# Patient Record
Sex: Male | Born: 1945 | Race: White | Hispanic: No | Marital: Married | Smoking: Never smoker
Health system: Southern US, Community
[De-identification: ages and names within clinical notes are randomized; demographics above are authoritative.]

## PROBLEM LIST (undated history)

## (undated) DIAGNOSIS — I10 Essential (primary) hypertension: Secondary | ICD-10-CM

## (undated) DIAGNOSIS — I4891 Unspecified atrial fibrillation: Secondary | ICD-10-CM

## (undated) DIAGNOSIS — I6990 Unspecified sequelae of unspecified cerebrovascular disease: Secondary | ICD-10-CM

## (undated) DIAGNOSIS — E785 Hyperlipidemia, unspecified: Secondary | ICD-10-CM

## (undated) HISTORY — DX: Unspecified sequelae of unspecified cerebrovascular disease: I69.90

## (undated) HISTORY — DX: Essential (primary) hypertension: I10

## (undated) HISTORY — PX: CATARACT EXTRACTION: SUR2

---

## 2000-02-06 ENCOUNTER — Encounter: Admission: RE | Admit: 2000-02-06 | Discharge: 2000-02-06 | Payer: Self-pay | Admitting: Sports Medicine

## 2000-02-08 ENCOUNTER — Encounter: Admission: RE | Admit: 2000-02-08 | Discharge: 2000-02-08 | Payer: Self-pay | Admitting: Sports Medicine

## 2005-03-14 ENCOUNTER — Encounter: Admission: RE | Admit: 2005-03-14 | Discharge: 2005-03-14 | Payer: Self-pay | Admitting: Family Medicine

## 2005-04-17 ENCOUNTER — Encounter: Admission: RE | Admit: 2005-04-17 | Discharge: 2005-04-17 | Payer: Self-pay | Admitting: Family Medicine

## 2005-04-25 ENCOUNTER — Encounter: Admission: RE | Admit: 2005-04-25 | Discharge: 2005-04-25 | Payer: Self-pay | Admitting: Family Medicine

## 2005-05-16 ENCOUNTER — Encounter: Admission: RE | Admit: 2005-05-16 | Discharge: 2005-05-16 | Payer: Self-pay | Admitting: Family Medicine

## 2005-07-16 ENCOUNTER — Encounter: Admission: RE | Admit: 2005-07-16 | Discharge: 2005-07-16 | Payer: Self-pay | Admitting: Family Medicine

## 2005-07-24 ENCOUNTER — Encounter: Admission: RE | Admit: 2005-07-24 | Discharge: 2005-07-24 | Payer: Self-pay | Admitting: Family Medicine

## 2005-07-31 ENCOUNTER — Encounter: Admission: RE | Admit: 2005-07-31 | Discharge: 2005-07-31 | Payer: Self-pay | Admitting: Family Medicine

## 2005-08-21 ENCOUNTER — Encounter: Admission: RE | Admit: 2005-08-21 | Discharge: 2005-08-21 | Payer: Self-pay | Admitting: Family Medicine

## 2006-03-20 ENCOUNTER — Encounter: Admission: RE | Admit: 2006-03-20 | Discharge: 2006-03-20 | Payer: Self-pay | Admitting: Diagnostic Radiology

## 2007-07-27 IMAGING — US US EXTREM LOW VENOUS BILAT
1 series · 13 of 24 positions shown · non-contrast
Comparison: none

CLINICAL DATA: Varicose veins.
 BILATERAL LOWER EXTREMITY VENOUS ULTRASOUND:
TECHNIQUE: Gray scale, color, and Doppler interrogation of the lower extremity venous systems was performed.

[Series 1: unknown · 13 of 52 slices shown]
[im 1/52]
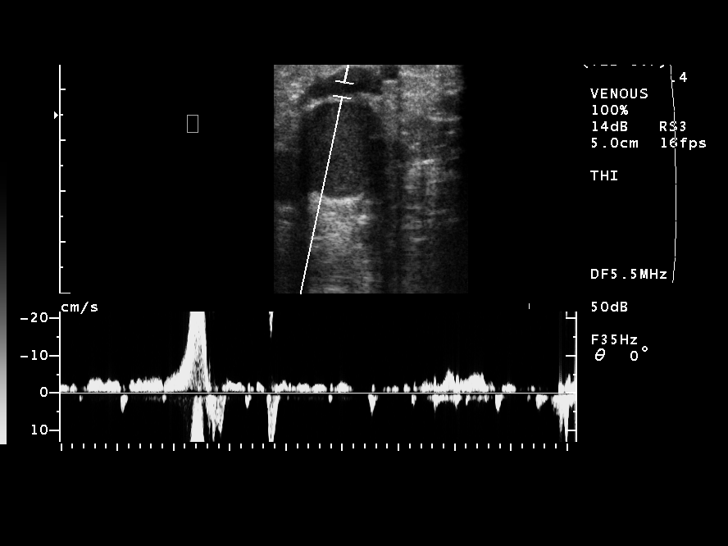
[im 5/52]
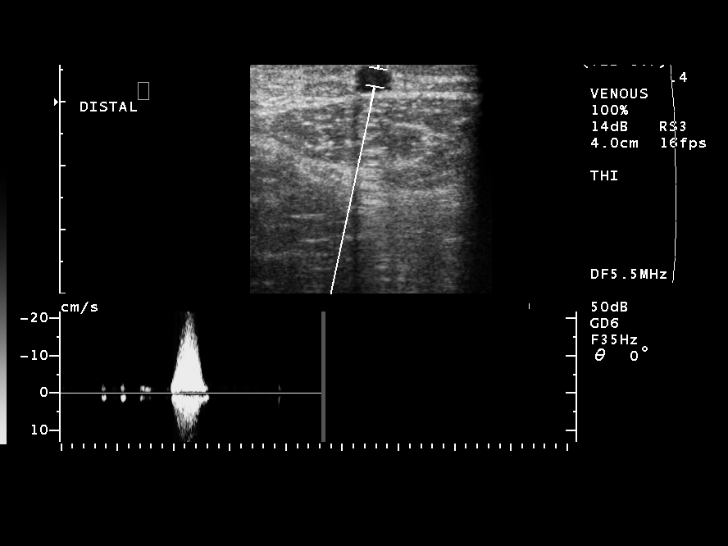
[im 9/52]
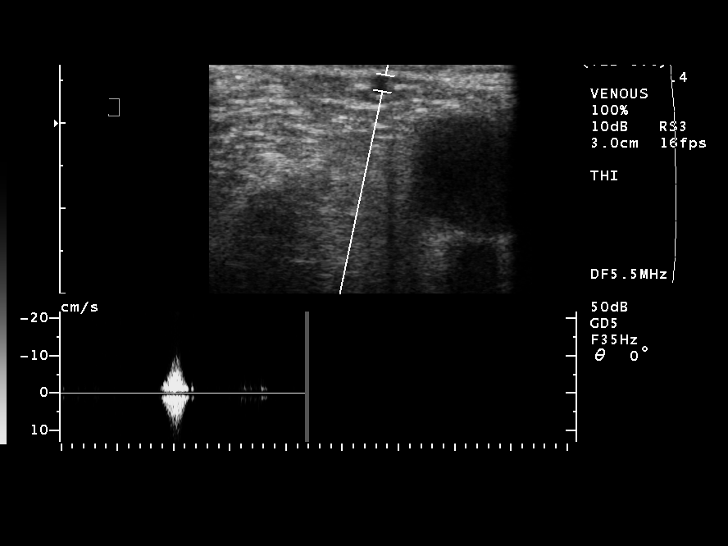
[im 14/52]
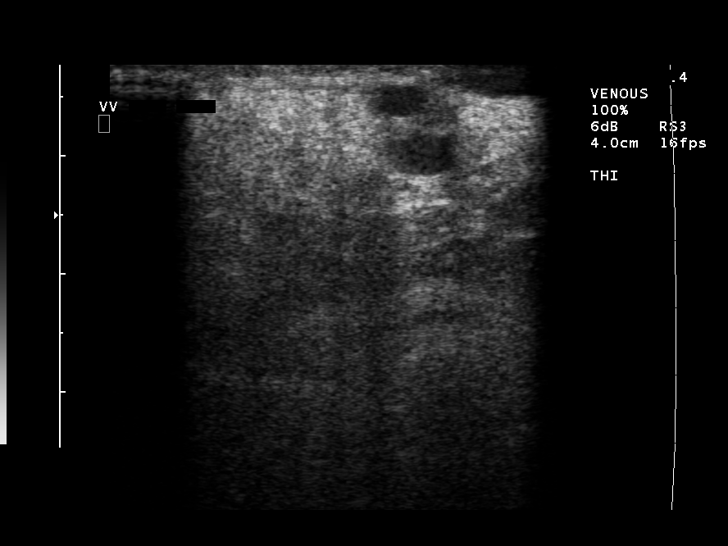
[im 18/52]
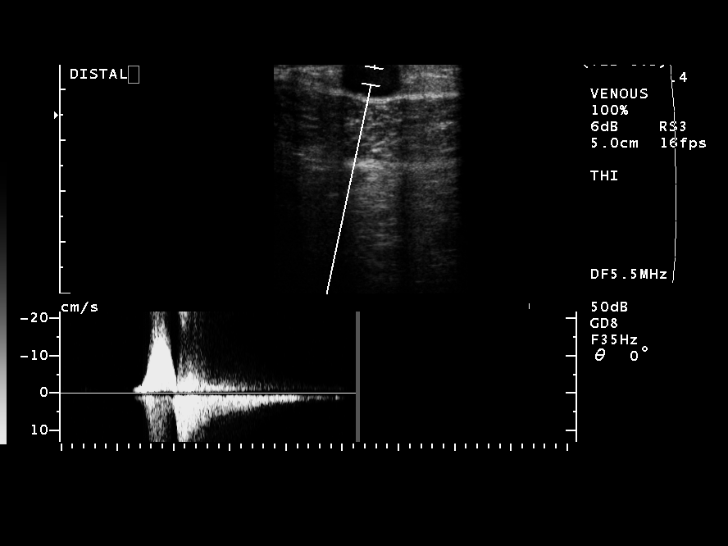
[im 23/52]
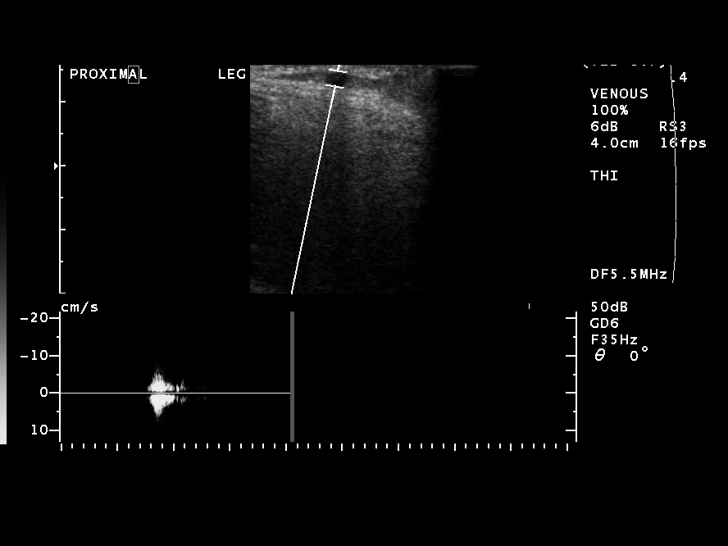
[im 27/52]
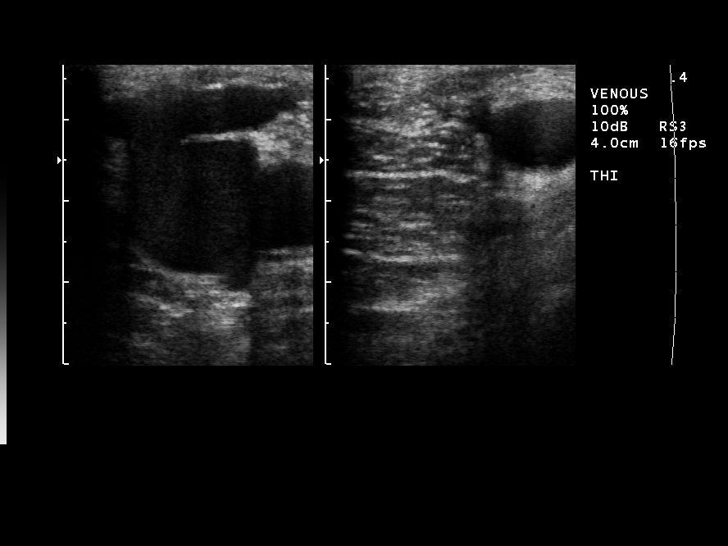
[im 29/52]
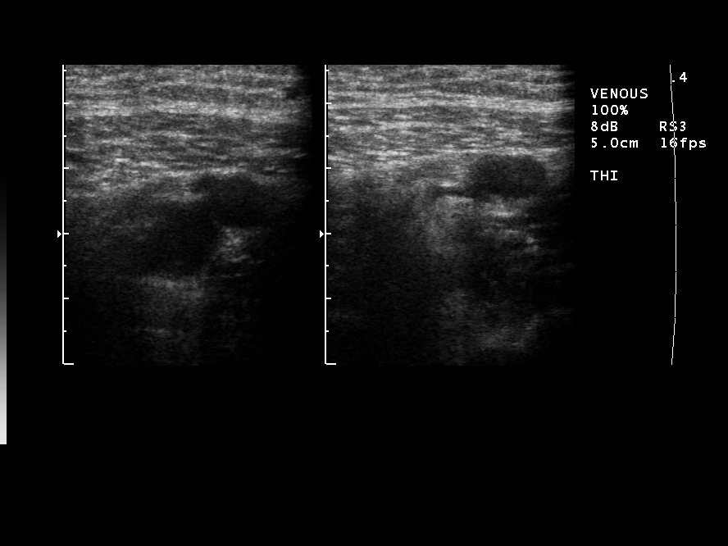
[im 34/52]
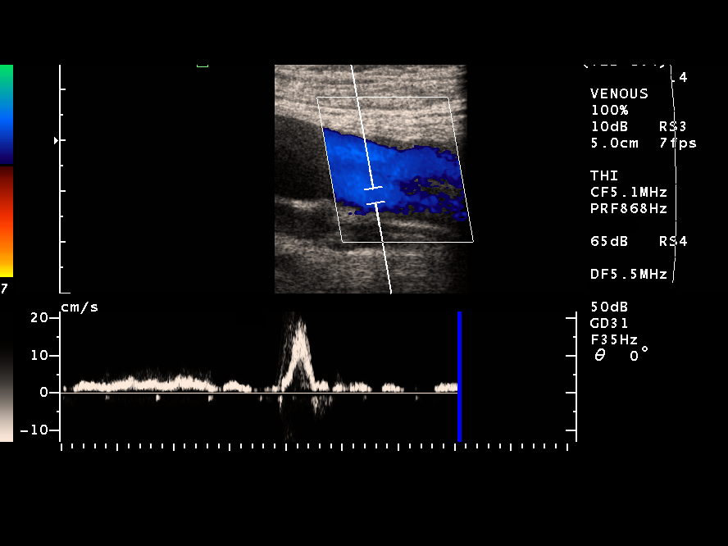
[im 38/52]
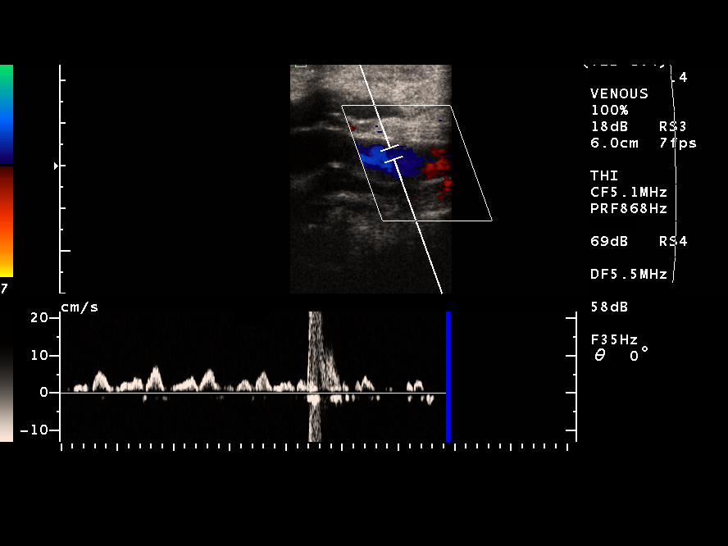
[im 43/52]
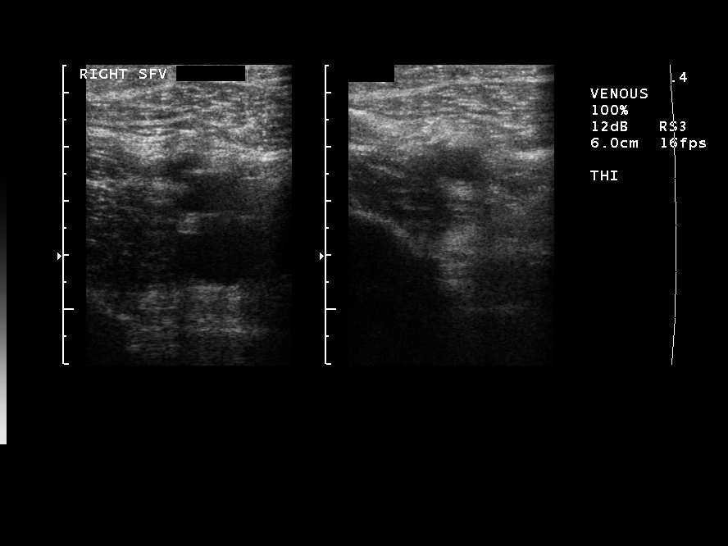
[im 47/52]
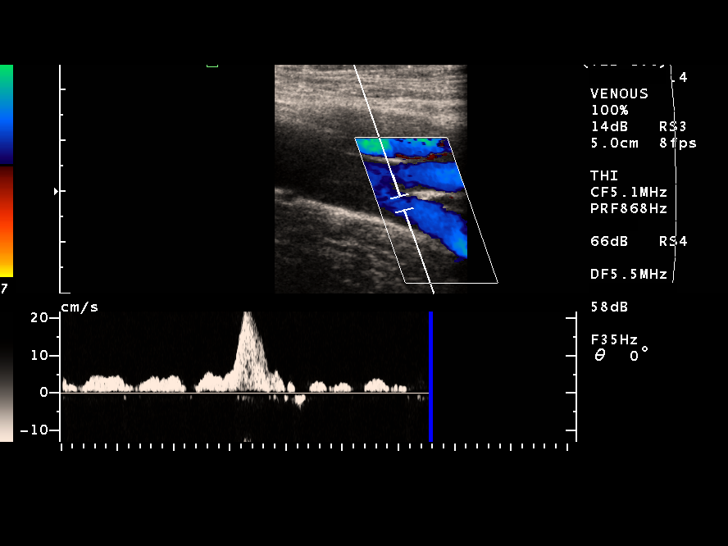
[im 52/52]
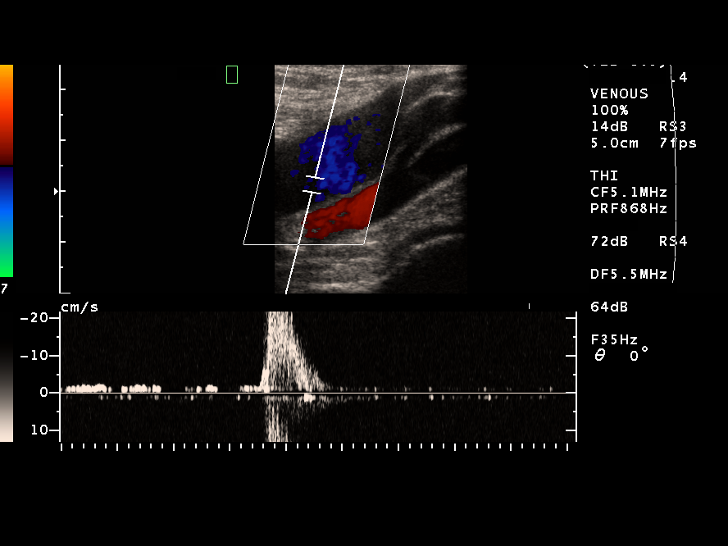

[13 of 24 positions shown; findings below may reference images not displayed]

FINDINGS: Right Lower Extremity:
 The right great saphenous vein and short saphenous vein are competent without evidence of reflux.  Deep venous system is widely patent.  No DVT.  Ultrasound over the posterior calf varicosity shows that this arises from a incompetent perforator vein in the proximal posterior calf.  
 Left Lower Extremity:
 The left great saphenous vein is significantly enlarged, measuring up to 21 mm in greatest diameter proximally.  There is significant reflux within the left great saphenous vein from the saphenofemoral junction into the proximal calf.  Large varicose veins arise from this refluxing segment of left great saphenous vein in the region of the distal thigh.  Below the proximal calf, no reflux in the great saphenous vein.  The small saphenous vein is competent.  No evidence of DVT.
IMPRESSION: 1.  Enlarged, incompetent left great saphenous vein leading to most of the varicosities in the knee and calf regions.  This would be amendable to endovenous laser ablation.  
 2.  Some of the varicosities in the posterior left knee region and in the right calf arise from incompetent perforator veins to the deep system and may require additional treatment with foam sclerotherapy or phlebectomy.  
 3.   No evidence of DVT.

## 2007-12-13 IMAGING — US EM EST PATIENT OFFICE LEVEL 3 (15 MIN)
1 series · 13 of 16 positions shown · non-contrast
Comparison: none

[Series 1: unknown · 13 of 28 slices shown]
[im 1/28]
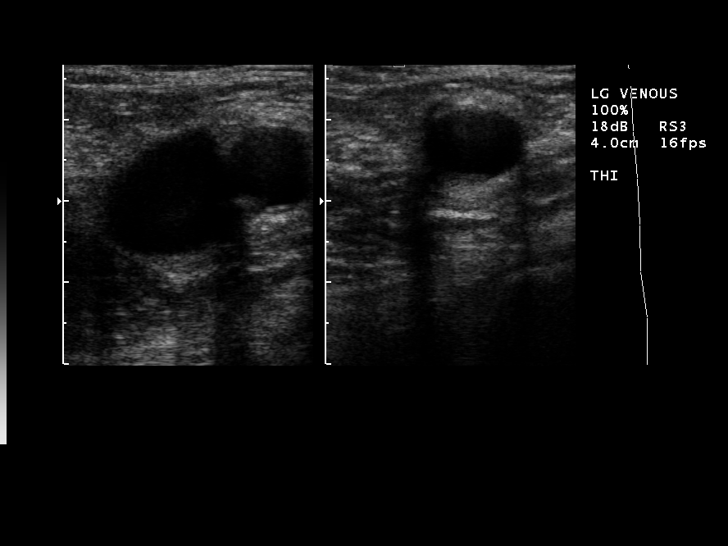
[im 2/28]
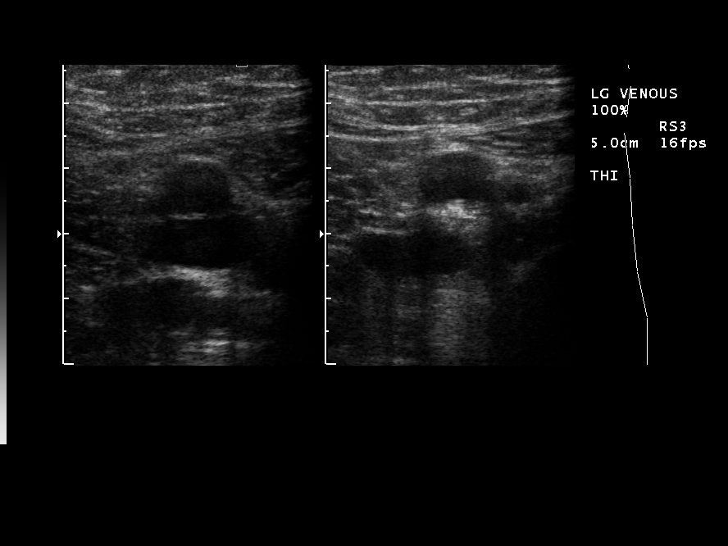
[im 6/28]
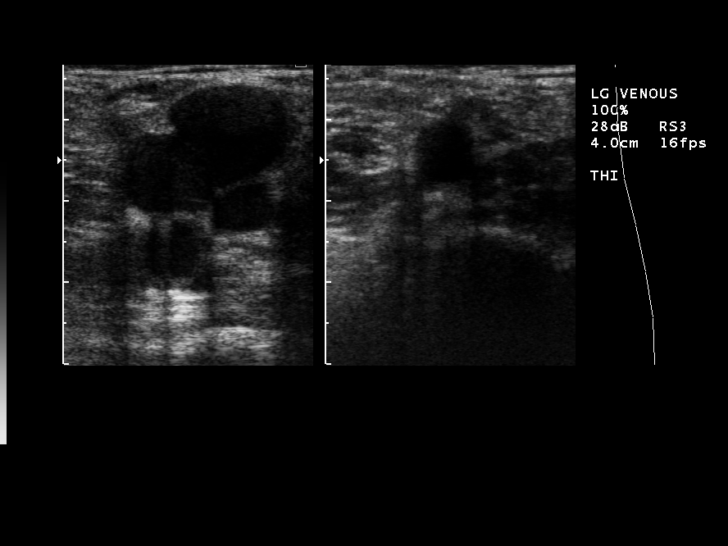
[im 8/28]
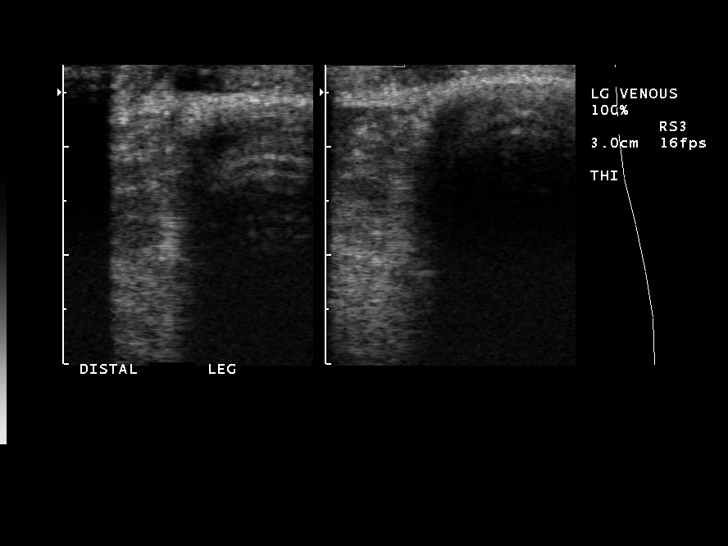
[im 10/28]
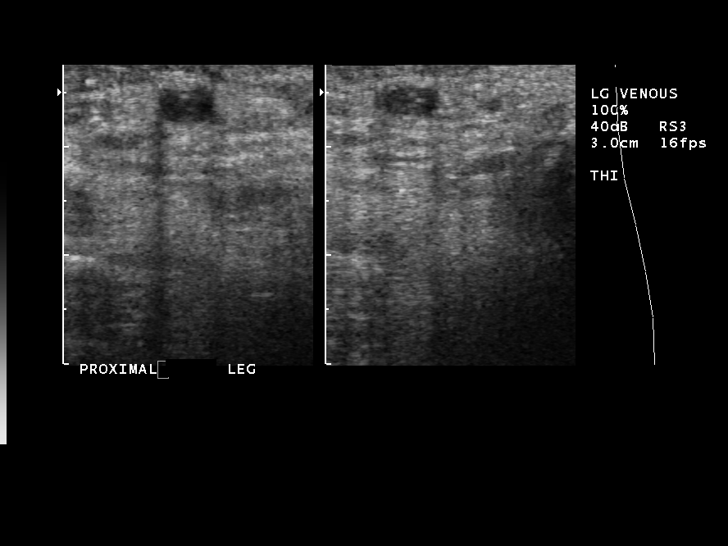
[im 11/28]
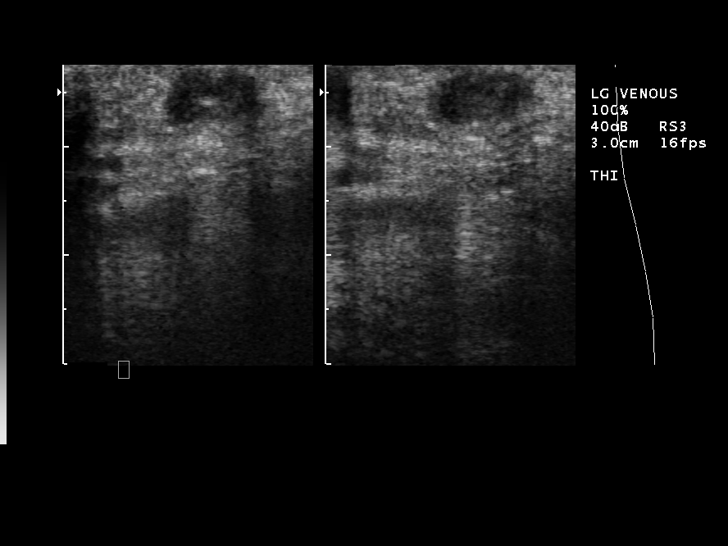
[im 15/28]
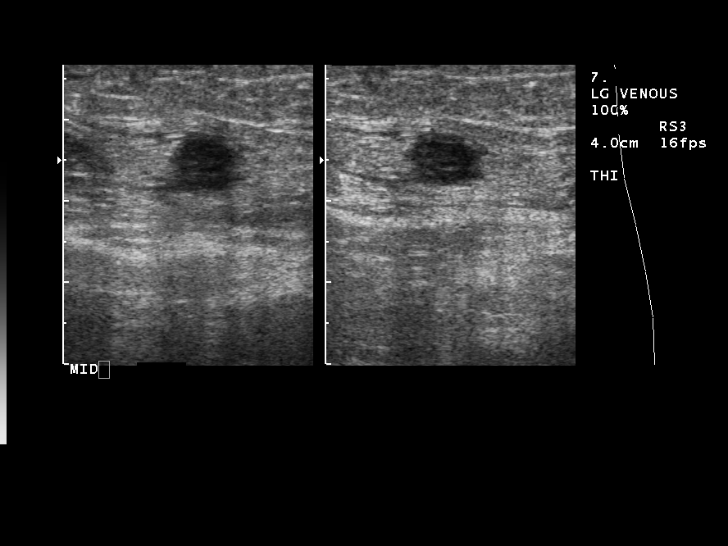
[im 17/28]
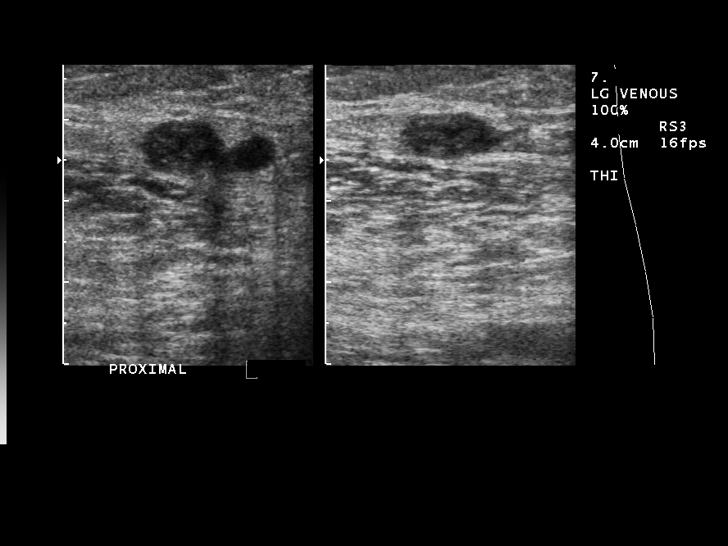
[im 19/28]
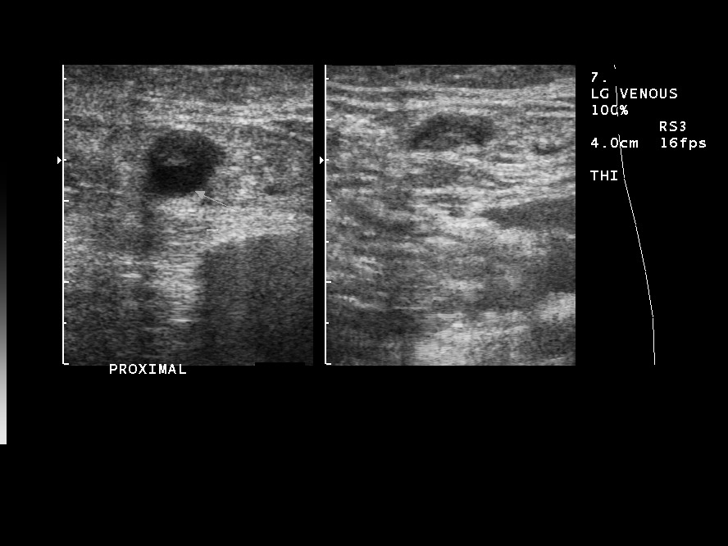
[im 20/28]
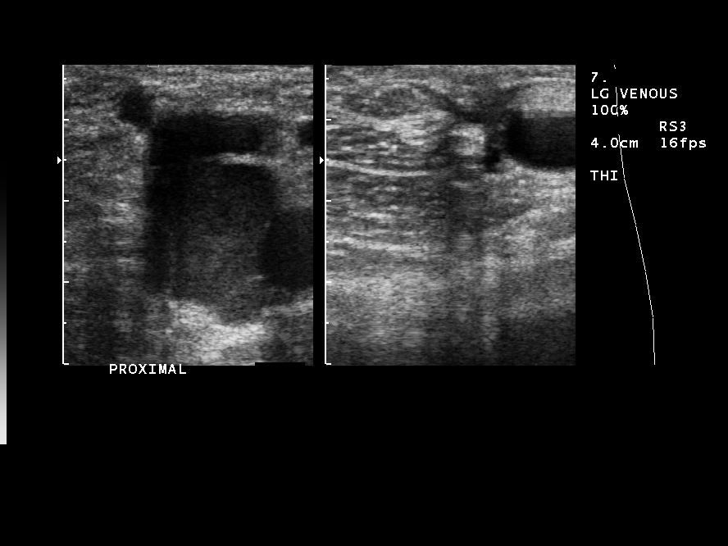
[im 22/28]
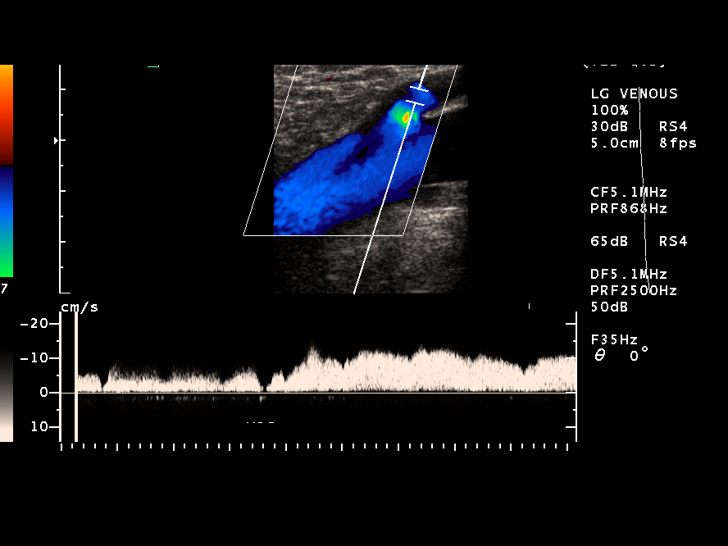
[im 26/28]
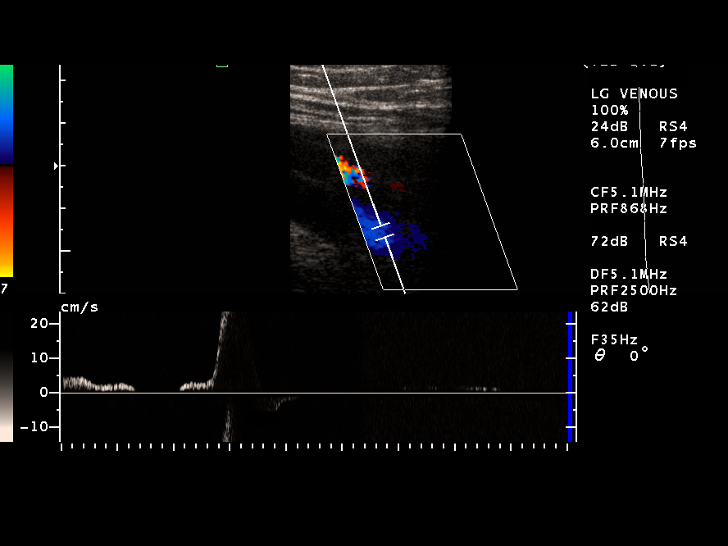
[im 28/28]
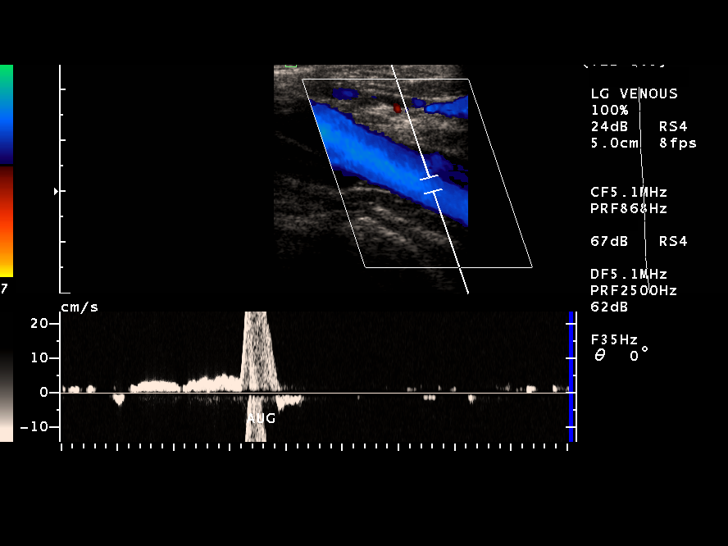

[13 of 16 positions shown; findings below may reference images not displayed]

ESTABLISHED PATIENT OFFICE VISIT ? [DATE]
 Philippe De Lara, M.D.
 [REDACTED] [HOSPITAL].
 RE:  Pierre Barca (DOB ? [birth year])
 Dear Dr. Emulo: 
 I had the opportunity to see your patient, Daisei Arifuku, today at his scheduled one week appointment post repeat laser ablation of the left greater saphenous vein to treat symptomatic varicose veins.  He is doing well.  He describes the expected amount of tenderness along the treatment course especially in the lower thigh region.  He has tolerated the graduated compression stockings, using them nearly round the clock.  He has tolerated a light exercise regimen.  
 On exam, the expected amount of bruising along the treatment course.  Mild tenderness over the greater saphenous vein in the lower thigh.  No erythema, swelling, or fluctuance.  Skin entry site has nearly completely healed. 
 Today?s venous Doppler ultrasound demonstrates nearly complete occlusion of the treated segment of the left GSV with partial thrombosis in the more superior aspect several cms below the saphenofemoral junction, which appears to be related to inflow from two varicose veins.  The deep venous system remains normal.  
 My impression is that he is doing well one week status post transcatheter laser occlusion of the left greater saphenous vein.  I encouraged continued use of the graduated compression stockings during waking hours.  I encouraged use of an exercise regimen.  He will hold off on restarting  his baby aspirin until his next visit.  We will plan to see him back at the one month follow-up.  He knows to call should there be any interval questions or problems. 
 Sincerely,
 DDH:chc

## 2013-06-30 ENCOUNTER — Inpatient Hospital Stay (HOSPITAL_COMMUNITY)
Admission: EM | Admit: 2013-06-30 | Discharge: 2013-07-02 | DRG: 063 | Disposition: A | Discharge status: Home / Self Care | Payer: Medicare Other | Attending: Neurology | Admitting: Neurology

## 2013-06-30 ENCOUNTER — Emergency Department (HOSPITAL_COMMUNITY): Payer: Medicare Other

## 2013-06-30 ENCOUNTER — Encounter (HOSPITAL_COMMUNITY): Payer: Self-pay | Admitting: Neurology

## 2013-06-30 ENCOUNTER — Inpatient Hospital Stay (HOSPITAL_COMMUNITY): Payer: Medicare Other

## 2013-06-30 DIAGNOSIS — I6350 Cerebral infarction due to unspecified occlusion or stenosis of unspecified cerebral artery: Principal | ICD-10-CM

## 2013-06-30 DIAGNOSIS — N289 Disorder of kidney and ureter, unspecified: Secondary | ICD-10-CM | POA: Diagnosis present

## 2013-06-30 DIAGNOSIS — E785 Hyperlipidemia, unspecified: Secondary | ICD-10-CM

## 2013-06-30 DIAGNOSIS — I10 Essential (primary) hypertension: Secondary | ICD-10-CM

## 2013-06-30 DIAGNOSIS — I4891 Unspecified atrial fibrillation: Secondary | ICD-10-CM

## 2013-06-30 DIAGNOSIS — I639 Cerebral infarction, unspecified: Secondary | ICD-10-CM

## 2013-06-30 DIAGNOSIS — E669 Obesity, unspecified: Secondary | ICD-10-CM | POA: Diagnosis present

## 2013-06-30 DIAGNOSIS — Z8249 Family history of ischemic heart disease and other diseases of the circulatory system: Secondary | ICD-10-CM

## 2013-06-30 DIAGNOSIS — Z6825 Body mass index (BMI) 25.0-25.9, adult: Secondary | ICD-10-CM

## 2013-06-30 DIAGNOSIS — R4701 Aphasia: Secondary | ICD-10-CM | POA: Diagnosis present

## 2013-06-30 HISTORY — DX: Hyperlipidemia, unspecified: E78.5

## 2013-06-30 HISTORY — DX: Unspecified atrial fibrillation: I48.91

## 2013-06-30 LAB — COMPREHENSIVE METABOLIC PANEL
ALBUMIN: 3.9 g/dL (ref 3.5–5.2)
ALK PHOS: 66 U/L (ref 39–117)
ALT: 26 U/L (ref 0–53)
AST: 35 U/L (ref 0–37)
BILIRUBIN TOTAL: 0.4 mg/dL (ref 0.3–1.2)
BUN: 30 mg/dL — ABNORMAL HIGH (ref 6–23)
CO2: 21 mEq/L (ref 19–32)
Calcium: 9.2 mg/dL (ref 8.4–10.5)
Chloride: 104 mEq/L (ref 96–112)
Creatinine, Ser: 1.22 mg/dL (ref 0.50–1.35)
GFR calc Af Amer: 69 mL/min — ABNORMAL LOW (ref >90)
GFR calc non Af Amer: 59 mL/min — ABNORMAL LOW (ref >90)
Glucose, Bld: 110 mg/dL — ABNORMAL HIGH (ref 70–99)
POTASSIUM: 3.8 meq/L (ref 3.7–5.3)
Sodium: 142 mEq/L (ref 137–147)
TOTAL PROTEIN: 7.1 g/dL (ref 6.0–8.3)

## 2013-06-30 LAB — CBC
HCT: 41.9 % (ref 39.0–52.0)
HEMOGLOBIN: 14.7 g/dL (ref 13.0–17.0)
MCH: 30.9 pg (ref 26.0–34.0)
MCHC: 35.1 g/dL (ref 30.0–36.0)
MCV: 88.2 fL (ref 78.0–100.0)
Platelets: 229 10*3/uL (ref 150–400)
RBC: 4.75 MIL/uL (ref 4.22–5.81)
RDW: 13.1 % (ref 11.5–15.5)
WBC: 7.4 10*3/uL (ref 4.0–10.5)

## 2013-06-30 LAB — DIFFERENTIAL
BASOS ABS: 0 10*3/uL (ref 0.0–0.1)
BASOS PCT: 0 % (ref 0–1)
Eosinophils Absolute: 0.1 10*3/uL (ref 0.0–0.7)
Eosinophils Relative: 1 % (ref 0–5)
Lymphocytes Relative: 21 % (ref 12–46)
Lymphs Abs: 1.5 10*3/uL (ref 0.7–4.0)
Monocytes Absolute: 0.5 10*3/uL (ref 0.1–1.0)
Monocytes Relative: 7 % (ref 3–12)
NEUTROS ABS: 5.2 10*3/uL (ref 1.7–7.7)
Neutrophils Relative %: 71 % (ref 43–77)

## 2013-06-30 LAB — I-STAT CHEM 8, ED
BUN: 30 mg/dL — AB (ref 6–23)
Calcium, Ion: 1.19 mmol/L (ref 1.13–1.30)
Chloride: 106 mEq/L (ref 96–112)
Creatinine, Ser: 1.3 mg/dL (ref 0.50–1.35)
Glucose, Bld: 109 mg/dL — ABNORMAL HIGH (ref 70–99)
HCT: 43 % (ref 39.0–52.0)
HEMOGLOBIN: 14.6 g/dL (ref 13.0–17.0)
Potassium: 3.7 mEq/L (ref 3.7–5.3)
SODIUM: 141 meq/L (ref 137–147)
TCO2: 22 mmol/L (ref 0–100)

## 2013-06-30 LAB — URINALYSIS, ROUTINE W REFLEX MICROSCOPIC
Bilirubin Urine: NEGATIVE
Glucose, UA: NEGATIVE mg/dL
HGB URINE DIPSTICK: NEGATIVE
Ketones, ur: 15 mg/dL — AB
Leukocytes, UA: NEGATIVE
NITRITE: NEGATIVE
PROTEIN: NEGATIVE mg/dL
SPECIFIC GRAVITY, URINE: 1.015 (ref 1.005–1.030)
UROBILINOGEN UA: 0.2 mg/dL (ref 0.0–1.0)
pH: 5 (ref 5.0–8.0)

## 2013-06-30 LAB — PROTIME-INR
INR: 1.12 (ref 0.00–1.49)
Prothrombin Time: 14.2 seconds (ref 11.6–15.2)

## 2013-06-30 LAB — I-STAT TROPONIN, ED: Troponin i, poc: 0.01 ng/mL (ref 0.00–0.08)

## 2013-06-30 LAB — RAPID URINE DRUG SCREEN, HOSP PERFORMED
AMPHETAMINES: NOT DETECTED
BARBITURATES: NOT DETECTED
Benzodiazepines: NOT DETECTED
Cocaine: NOT DETECTED
OPIATES: NOT DETECTED
TETRAHYDROCANNABINOL: NOT DETECTED

## 2013-06-30 LAB — MRSA PCR SCREENING: MRSA by PCR: NEGATIVE

## 2013-06-30 LAB — APTT: APTT: 27 s (ref 24–37)

## 2013-06-30 LAB — ETHANOL

## 2013-06-30 MED ORDER — ACETAMINOPHEN 650 MG RE SUPP: 650.0000 mg | RECTAL | Status: DC | PRN

## 2013-06-30 MED ORDER — IOHEXOL 350 MG/ML SOLN
50.0000 mL | Freq: Once | INTRAVENOUS | Status: AC | PRN
  Administered 2013-06-30: 50 mL via INTRAVENOUS

## 2013-06-30 MED ORDER — SODIUM CHLORIDE 0.9 % IV SOLN
INTRAVENOUS | Status: DC
  Administered 2013-06-30 – 2013-07-01 (×2): via INTRAVENOUS

## 2013-06-30 MED ORDER — ACETAMINOPHEN 325 MG PO TABS
650.0000 mg | ORAL_TABLET | ORAL | Status: DC | PRN
  Administered 2013-07-01 (×2): 650 mg via ORAL
  Filled 2013-06-30 (×2): qty 2

## 2013-06-30 MED ORDER — ALTEPLASE (STROKE) FULL DOSE INFUSION
0.9000 mg/kg | Freq: Once | INTRAVENOUS | Status: AC
  Administered 2013-06-30: 65 mg via INTRAVENOUS
  Filled 2013-06-30: qty 65

## 2013-06-30 MED ORDER — LABETALOL HCL 5 MG/ML IV SOLN
10.0000 mg | INTRAVENOUS | Status: DC | PRN
  Filled 2013-06-30: qty 4

## 2013-06-30 MED ORDER — SENNOSIDES-DOCUSATE SODIUM 8.6-50 MG PO TABS
1.0000 | ORAL_TABLET | Freq: Every evening | ORAL | Status: DC | PRN
  Administered 2013-07-01: 1 via ORAL
  Filled 2013-06-30: qty 1

## 2013-06-30 MED ORDER — PANTOPRAZOLE SODIUM 40 MG IV SOLR
40.0000 mg | Freq: Every day | INTRAVENOUS | Status: DC
  Administered 2013-06-30 – 2013-07-01 (×2): 40 mg via INTRAVENOUS
  Filled 2013-06-30 (×3): qty 40

## 2013-06-30 MED ORDER — IOHEXOL 350 MG/ML SOLN
75.0000 mL | Freq: Once | INTRAVENOUS | Status: AC | PRN
  Administered 2013-06-30: 75 mL via INTRAVENOUS

## 2013-06-30 NOTE — ED Notes (Signed)
Pt to ct scan.

## 2013-06-30 NOTE — ED Notes (Signed)
Spoke with Preston Chapman [facility] pt was normal at that time.

## 2013-06-30 NOTE — ED Notes (Signed)
Pt periodically expressive aphasia. Pt looked at watch and gave correct time, then unable to give place or age. Pt recognizes friends and calls by name.
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
## 2013-06-30 NOTE — Progress Notes (Signed)
Pt arrived at 2000 from ED. Pt oriented to room and equipment, NIH performed. Will monitor.

## 2013-06-30 NOTE — ED Notes (Signed)
Pt alert and MAE randomly. When asked name pt states "yes sir, Preston Chapman" patient able to give date but is unable to state place or complete name. EDP called to bedside.

## 2013-06-30 NOTE — ED Notes (Signed)
Spoke with family member [place] travel to [place] (2 weeks ago) Pt c/o fatigue at that time. Pt under md supervision related to atrial fibrillation.
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
## 2013-06-30 NOTE — ED Notes (Signed)
Return from ct scan. Stroke team at bedside. Dr. Amada JupiterKirkpatrick speaking with daughter related to treatment.

## 2013-06-30 NOTE — ED Notes (Signed)
Gemma Payorenise Wolf Rn and [place] from rapid response remains at bedside.

## 2013-06-30 NOTE — ED Notes (Signed)
Friends at bedside. Family updated.

## 2013-06-30 NOTE — ED Notes (Signed)
Pt return to ct for chest related to tenderness in central chest.
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
## 2013-06-30 NOTE — ED Notes (Signed)
Code stroke activated ?

## 2013-07-01 ENCOUNTER — Encounter (HOSPITAL_COMMUNITY): Payer: Self-pay | Admitting: Nurse Practitioner

## 2013-07-01 ENCOUNTER — Inpatient Hospital Stay (HOSPITAL_COMMUNITY): Payer: Medicare Other

## 2013-07-01 LAB — GLUCOSE, CAPILLARY
Glucose-Capillary: 112 mg/dL — ABNORMAL HIGH (ref 70–99)
Glucose-Capillary: 100 mg/dL — ABNORMAL HIGH (ref 70–99)
Glucose-Capillary: 95 mg/dL (ref 70–99)
Glucose-Capillary: 96 mg/dL (ref 70–99)

## 2013-07-01 LAB — HEMOGLOBIN A1C
Hgb A1c MFr Bld: 5.8 % — ABNORMAL HIGH (ref <5.7)
MEAN PLASMA GLUCOSE: 120 mg/dL — AB (ref <117)

## 2013-07-01 LAB — LIPID PANEL
CHOL/HDL RATIO: 2.5 ratio
CHOLESTEROL: 136 mg/dL (ref 0–200)
HDL: 54 mg/dL (ref >39)
LDL Cholesterol: 70 mg/dL (ref 0–99)
Triglycerides: 61 mg/dL (ref <150)
VLDL: 12 mg/dL (ref 0–40)

## 2013-07-01 MED ORDER — STROKE: EARLY STAGES OF RECOVERY BOOK
Freq: Once | Status: AC
  Administered 2013-07-01: 19:00:00
  Filled 2013-07-01: qty 1

## 2013-07-01 MED ORDER — ASPIRIN 325 MG PO TABS
325.0000 mg | ORAL_TABLET | Freq: Every day | ORAL | Status: DC
  Administered 2013-07-01: 325 mg via ORAL
  Filled 2013-07-01 (×2): qty 1

## 2013-07-01 MED FILL — Medication: Qty: 1 | Status: AC

## 2013-07-01 NOTE — Progress Notes (Signed)
PT Cancellation Note  Patient Details Name: Preston Chapman MRN: [id number] DOB: [birth year]   Cancelled Treatment:    Reason Eval/Treat Not Completed: Medical issues which prohibited therapy;Patient not medically ready. Pt given TPA and continues to be on bedrest. Will re-attempt to evaluate pt when medically appropriate.    Donnamarie [place], Felicia Both [place], [place] CarolinaPT [phone] 07/01/2013, 8:18 AM
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
## 2013-07-01 NOTE — Progress Notes (Signed)
Pt's silver watch given to wife   Preston Chapman, Preston Chapman

## 2013-07-01 NOTE — Progress Notes (Signed)
  Echocardiogram 2D Echocardiogram has been performed.  Preston Chapman FRANCES 07/01/2013, 4:59 PM

## 2013-07-02 ENCOUNTER — Telehealth: Payer: Self-pay | Admitting: Neurology

## 2013-07-02 LAB — GLUCOSE, CAPILLARY
GLUCOSE-CAPILLARY: 82 mg/dL (ref 70–99)
GLUCOSE-CAPILLARY: 92 mg/dL (ref 70–99)

## 2013-07-02 MED ORDER — RIVAROXABAN 20 MG PO TABS: 20.0000 mg | ORAL_TABLET | Freq: Every day | ORAL | Status: DC

## 2013-07-02 MED ORDER — ATORVASTATIN CALCIUM 40 MG PO TABS
40.0000 mg | ORAL_TABLET | Freq: Every day | ORAL | Status: DC
  Filled 2013-07-02: qty 1

## 2013-07-02 MED ORDER — RIVAROXABAN 20 MG PO TABS
20.0000 mg | ORAL_TABLET | Freq: Every day | ORAL | Status: DC
  Administered 2013-07-02: 20 mg via ORAL
  Filled 2013-07-02 (×2): qty 1

## 2013-07-02 MED ORDER — LISINOPRIL 2.5 MG PO TABS: 2.5000 mg | ORAL_TABLET | Freq: Every day | ORAL | Status: DC

## 2013-07-02 MED ORDER — LISINOPRIL 2.5 MG PO TABS
2.5000 mg | ORAL_TABLET | Freq: Every day | ORAL | Status: DC
  Administered 2013-07-02: 2.5 mg via ORAL
  Filled 2013-07-02 (×2): qty 1

## 2013-07-02 MED ORDER — PANTOPRAZOLE SODIUM 40 MG PO TBEC: 40.0000 mg | DELAYED_RELEASE_TABLET | Freq: Every day | ORAL | Status: DC

## 2013-07-02 MED ORDER — LISINOPRIL 10 MG PO TABS: 10.0000 mg | ORAL_TABLET | Freq: Every day | ORAL | Status: DC
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
## 2013-07-02 NOTE — Telephone Encounter (Signed)
States that Dr. Pearlean BrownieSethi discharged her father today and didn't not give him orders for speech therapy. I advised her she may need a followup visit in the clinic she states that she wants an order. And was not told anything at the hospital. States she called the hospital and was told to contact Dr. Pearlean BrownieSethi

## 2013-07-02 NOTE — Care Management Note (Addendum)
    Page 1 of 2   07/04/2013     5:01:46 PM   CARE MANAGEMENT NOTE 07/04/2013  Patient:  Preston Chapman,Preston Chapman   Account Number:  [id number]  Date Initiated:  07/01/2013  Documentation initiated by:  Carlyle LipaBRYSON,MICHELLE  Subjective/Objective Assessment:   CVA; given tPA     Action/Plan:   await PT/OT evals to determine d/c needs   Anticipated DC Date:  07/02/2013   Anticipated DC Plan:  HOME/SELF CARE      DC Planning Services  CM consult      Choice offered to / List presented to:             Status of service:  Completed, signed off Medicare Important Message given?   (If response is "NO", the following Medicare IM given date fields will be blank) Date Medicare IM given:   Date Additional Medicare IM given:    Discharge Disposition:  HOME/SELF CARE  Per UR Regulation:  Reviewed for med. necessity/level of care/duration of stay  If discussed at Long Length of Stay Meetings, dates discussed:    Comments:  07/04/13/14:40 CM received telephone order from Wilfred Curtisave Reinhuls, PA  to send [facility]. Referral faxed per telephone order with instruction to call pt to schedule an appt (information on facesheet which was faxed along with SLP eval and order request). Several attempts were made to call pt but no answer.   No other CM needs were communicated. Freddy Jakschsarah Wane Mollett, BSN, Cm [phone].   07-02-13 Xarelto 20 mg daily PO x 30 days  PT COPAY WILL BE $40 - NO PRIOR AUTH REQUIRED  Patient aware  Ronny FlurryHeather Wile RN BSN [phone]   07-02-13 Patient discharging on Xarelto  Gave patient  30 day free Xarelto card he / family will have to call number on card to activate before going to his pharmacy . He will need separate 30 day prescription  with no refills and then a prescription for after the initial 30 days.  Andres Labrumave Smith PA aware .  Will check on co pay cost for 20 mg PO daily x 30 days .   Ronny FlurryHeather Wile RN BSN
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
## 2013-07-02 NOTE — Progress Notes (Signed)
ANTICOAGULATION CONSULT NOTE - Initial Consult  Pharmacy Consult for rivaroxaban Indication: atrial fibrillation  No Known Allergies  Patient Measurements: Height: 5\' 8"  (172.7 cm) Weight: 169 lb 12.1 oz (77 kg) IBW/kg (Calculated) : 68.4  Vital Signs: Temp: 98.5 F (36.9 C) (03/27 0800) Temp src: Oral (03/27 0800) BP: 166/89 mmHg (03/27 1115) Pulse Rate: 61 (03/27 1115)  Labs:  Recent Labs  06/30/13 1558 06/30/13 1610 06/30/13 2150  HGB 14.7 14.6  --   HCT 41.9 43.0  --   PLT 229  --   --   APTT  --   --  27  LABPROT  --   --  14.2  INR  --   --  1.12  CREATININE 1.22 1.30  --     Estimated Creatinine Clearance: 52.6 ml/min (by C-G formula based on Cr of 1.3).  Medical History: Past Medical History  Diagnosis Date  . HTN (hypertension)   . Hyperlipidemia     Medications:  See medication list  Assessment: 68 year old male with ischemic stroke on 3/25, TPA was administered. Pt also has history of AFib, of which he was not on anticoagulation prior to admission.  CBC from 3/25 is stable.  Pt's creatinine is stable, his renal function is near borderline for requiring a lower Xarelto dose.  If renal function worsens any amount in the future, he will likely require a dose adjustment.  Goal of Therapy:  Monitor platelets by anticoagulation protocol: Yes   Plan:  Rivaroxaban 20 mg po daily CBC q72h  Agapito GamesAlison Joretta Eads, PharmD, BCPS Clinical Pharmacist Pager: [phone] 07/02/2013 11:48 AM
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
## 2013-07-05 ENCOUNTER — Other Ambulatory Visit: Payer: Self-pay | Admitting: *Deleted

## 2013-07-05 DIAGNOSIS — I6350 Cerebral infarction due to unspecified occlusion or stenosis of unspecified cerebral artery: Secondary | ICD-10-CM

## 2013-07-05 DIAGNOSIS — R4701 Aphasia: Secondary | ICD-10-CM

## 2013-07-05 NOTE — Telephone Encounter (Signed)
I made order for ST, and called and LMVM for Raynelle FanningJulie, daughter about the order for ST sent to [facility].

## 2013-07-05 NOTE — Progress Notes (Signed)
As per discharge summary.  Recommendation for ST.

## 2013-07-06 ENCOUNTER — Ambulatory Visit: Payer: Medicare Other | Attending: Internal Medicine | Admitting: Speech Pathology

## 2013-07-06 DIAGNOSIS — R4701 Aphasia: Secondary | ICD-10-CM | POA: Insufficient documentation

## 2013-07-06 DIAGNOSIS — IMO0001 Reserved for inherently not codable concepts without codable children: Secondary | ICD-10-CM | POA: Insufficient documentation

## 2013-07-08 ENCOUNTER — Ambulatory Visit: Payer: Medicare Other | Attending: Internal Medicine | Admitting: Speech Pathology

## 2013-07-08 DIAGNOSIS — R4701 Aphasia: Secondary | ICD-10-CM | POA: Insufficient documentation

## 2013-07-08 DIAGNOSIS — IMO0001 Reserved for inherently not codable concepts without codable children: Secondary | ICD-10-CM | POA: Insufficient documentation

## 2013-07-15 ENCOUNTER — Ambulatory Visit: Payer: Medicare Other

## 2013-07-15 ENCOUNTER — Encounter: Payer: Self-pay | Admitting: Internal Medicine

## 2013-07-16 ENCOUNTER — Ambulatory Visit: Payer: Medicare Other

## 2013-07-20 ENCOUNTER — Ambulatory Visit: Payer: Medicare Other | Admitting: Speech Pathology

## 2013-07-22 ENCOUNTER — Ambulatory Visit: Payer: Medicare Other | Admitting: Speech Pathology

## 2013-07-23 ENCOUNTER — Telehealth: Payer: Self-pay | Admitting: *Deleted

## 2013-07-23 NOTE — Telephone Encounter (Signed)
Called pt and spoke with pt's wife Raynelle FanningJulie concerning getting an earlier appt to see Dr. Pearlean BrownieSethi. I explained to the wife that Dr. Pearlean BrownieSethi and his NP, Larita FifeLynn is booked up and there were no opening and the pt is also on the waiting list in case someone cancels their appt. I advised the wife the if the pt has any other problems, questions or concerns to call the office. Pt's wife verbalized understanding.

## 2013-07-27 ENCOUNTER — Ambulatory Visit: Payer: Medicare Other | Admitting: Speech Pathology

## 2013-07-29 ENCOUNTER — Ambulatory Visit: Payer: Medicare Other | Admitting: Speech Pathology

## 2013-08-02 ENCOUNTER — Ambulatory Visit: Payer: Medicare Other

## 2013-08-03 ENCOUNTER — Encounter: Payer: BC Managed Care – PPO | Admitting: Speech Pathology

## 2013-08-05 ENCOUNTER — Ambulatory Visit: Payer: Medicare Other | Admitting: Speech Pathology

## 2013-08-10 ENCOUNTER — Ambulatory Visit: Payer: Medicare Other | Attending: Internal Medicine | Admitting: Speech Pathology

## 2013-08-10 DIAGNOSIS — IMO0001 Reserved for inherently not codable concepts without codable children: Secondary | ICD-10-CM | POA: Insufficient documentation

## 2013-08-10 DIAGNOSIS — R4701 Aphasia: Secondary | ICD-10-CM | POA: Insufficient documentation

## 2013-08-12 ENCOUNTER — Ambulatory Visit: Payer: Medicare Other | Admitting: Speech Pathology

## 2013-08-17 ENCOUNTER — Ambulatory Visit: Payer: Medicare Other | Admitting: Speech Pathology

## 2013-08-19 ENCOUNTER — Ambulatory Visit: Payer: Medicare Other | Admitting: Speech Pathology

## 2013-08-24 ENCOUNTER — Ambulatory Visit: Payer: Medicare Other | Admitting: Speech Pathology

## 2013-08-25 ENCOUNTER — Encounter: Payer: BC Managed Care – PPO | Admitting: Speech Pathology

## 2013-09-01 ENCOUNTER — Ambulatory Visit: Payer: BC Managed Care – PPO | Admitting: Interventional Cardiology

## 2013-09-01 ENCOUNTER — Ambulatory Visit: Payer: Medicare Other

## 2013-09-08 ENCOUNTER — Ambulatory Visit: Payer: Medicare Other | Attending: Internal Medicine

## 2013-09-08 DIAGNOSIS — IMO0001 Reserved for inherently not codable concepts without codable children: Secondary | ICD-10-CM | POA: Insufficient documentation

## 2013-09-08 DIAGNOSIS — R4701 Aphasia: Secondary | ICD-10-CM | POA: Insufficient documentation

## 2013-09-10 ENCOUNTER — Encounter: Payer: Self-pay | Admitting: Cardiology

## 2013-09-10 ENCOUNTER — Ambulatory Visit (INDEPENDENT_AMBULATORY_CARE_PROVIDER_SITE_OTHER): Payer: Medicare Other | Admitting: Interventional Cardiology

## 2013-09-10 VITALS — BP 205/94 | HR 65 | Ht 66.0 in | Wt 165.0 lb

## 2013-09-10 DIAGNOSIS — I639 Cerebral infarction, unspecified: Secondary | ICD-10-CM

## 2013-09-10 DIAGNOSIS — I359 Nonrheumatic aortic valve disorder, unspecified: Secondary | ICD-10-CM

## 2013-09-10 DIAGNOSIS — I4891 Unspecified atrial fibrillation: Secondary | ICD-10-CM

## 2013-09-10 DIAGNOSIS — I10 Essential (primary) hypertension: Secondary | ICD-10-CM | POA: Insufficient documentation

## 2013-09-10 DIAGNOSIS — E785 Hyperlipidemia, unspecified: Secondary | ICD-10-CM

## 2013-09-10 DIAGNOSIS — I6990 Unspecified sequelae of unspecified cerebrovascular disease: Secondary | ICD-10-CM | POA: Insufficient documentation

## 2013-09-10 DIAGNOSIS — I6350 Cerebral infarction due to unspecified occlusion or stenosis of unspecified cerebral artery: Secondary | ICD-10-CM

## 2013-09-10 MED ORDER — METOPROLOL TARTRATE 25 MG PO TABS: ORAL_TABLET | ORAL | Status: DC

## 2013-09-10 NOTE — Addendum Note (Signed)
Addended by: Corky Crafts on: 09/10/2013 11:15 AM   Modules accepted: Level of Service

## 2013-09-10 NOTE — Patient Instructions (Signed)
Your physician has recommended you make the following change in your medication:   1. Start Metoprolol Tartrate 25 mg 1 tablet twice a day as needed.   Your physician wants you to follow-up in: 1 year with Dr. Eldridge Dace. You will receive a reminder letter in the mail two months in advance. If you don't receive a letter, please call our office to schedule the follow-up appointment.

## 2013-09-10 NOTE — Progress Notes (Signed)
Patient ID: Preston Chapman, male   DOB: May 27, 1945, 68 y.o.   MRN: [id number]

## 2013-09-10 NOTE — Progress Notes (Signed)
Patient ID: Preston Chapman, male   DOB: 1945/07/01, 68 y.o.   MRN: [id number]     Patient ID: Preston Chapman MRN: [id number] DOB/AGE: December 04, 1945 68 y.o.    Referring Physician Dr. Kirby Funk   Reason for Consultation AFib, CVA  HPI: 68 y/o who had hyperlipidemia. He had paroxysmal atrial fibrillation discovered in 2014. He was managed with aspirin. His symptoms were rare. He presented with a CVA in March 2015. Echocardiogram showed no left atrial thrombus. LV function was normal. He has mild aortic stenosis. Since that time, he has been on Xarelto.  He has not had any further palpitations since starting the Xarelto.  He has decreased alcohol consumption. CT angiogram revealed "bulky, calcific plaque in the right carotid." Lipids have been well controlled. Blood pressure at home as been well controlled. He does have high readings when he comes to the doctor's office. At home, readings are typically in the 110 to 130 systolic range. He denies any chest discomfort. No shortness of breath, orthopnea, PND, dizziness, lightheadedness, syncope. No bleeding problems.  He is also wondering about the use of marijuana for treating the frustration that he feels with his speech problem post stroke.   Current Outpatient Prescriptions  Medication Sig Dispense Refill  . atorvastatin (LIPITOR) 40 MG tablet Take 40 mg by mouth daily.       . Cholecalciferol (VITAMIN D PO) Take 1 tablet by mouth daily.      Marland Kitchen lisinopril (PRINIVIL,ZESTRIL) 10 MG tablet Take 10 mg by mouth daily.      . Multiple Vitamin (MULTIVITAMIN WITH MINERALS) TABS tablet Take 1 tablet by mouth daily.      . Rivaroxaban (XARELTO) 20 MG TABS tablet Take 1 tablet (20 mg total) by mouth daily.  30 tablet  1   No current facility-administered medications for this visit.   Past Medical History  Diagnosis Date  . Essential hypertension, benign   . Hyperlipidemia   . Unspecified late effects of cerebrovascular disease   . Atrial  fibrillation     Family History  Problem Relation Age of Onset  . Hypertension Mother   . Hypertension Father     History   Social History  . Marital Status: Married    Spouse Name: N/A    Number of Children: N/A  . Years of Education: N/A   Occupational History  . Not on file.   Social History Main Topics  . Smoking status: Never Smoker   . Smokeless tobacco: Not on file  . Alcohol Use: Yes     Comment: social  . Drug Use: Not on file  . Sexual Activity: Not on file   Other Topics Concern  . Not on file   Social History Narrative  . No narrative on file    No past surgical history on file.    (Not in a hospital admission)  Review of systems complete and found to be negative unless listed above .  No nausea, vomiting.  No fever chills, No focal weakness,  No palpitations.  Physical Exam: Filed Vitals:   09/10/13 1023  BP: 205/94  Pulse: 65    Weight: 165 lb (74.844 kg)  Physical exam:  [place]/AT EOMI No JVD, No carotid bruit RRR S1S2 , 2/6 harsh, systolic murmur No wheezing Soft. NT, nondistended No edema. No focal motor or sensory deficits Normal affect  Labs:   Lab Results  Component Value Date   WBC 7.4 06/30/2013   HGB 14.6 06/30/2013  HCT 43.0 06/30/2013   MCV 88.2 06/30/2013   PLT 229 06/30/2013   No results found for this basename: NA, K, CL, CO2, BUN, CREATININE, CALCIUM, LABALBU, PROT, BILITOT, ALKPHOS, ALT, AST, GLUCOSE,  in the last 168 hours No results found for this basename: CKTOTAL, CKMB, CKMBINDEX, TROPONINI    Lab Results  Component Value Date   CHOL 136 07/01/2013   Lab Results  Component Value Date   HDL 54 07/01/2013   Lab Results  Component Value Date   LDLCALC 70 07/01/2013   Lab Results  Component Value Date   TRIG 61 07/01/2013   Lab Results  Component Value Date   CHOLHDL 2.5 07/01/2013   No results found for this basename: LDLDIRECT      Radiology:as above EKG:NSR, LAE, increased voltage  ASSESSMENT AND PLAN:  Atrial fibrillation with recent CVA  1) atrial fibrillation: He does not have many symptoms of atrial fibrillation.  He has had no palpitations since March. Will give metoprolol 25 mg by mouth twice a day when necessary palpitations. Palpitations become frequent, may need to make this a more regular medicine. He is maintaining sinus rhythm at this time.  2) CVA: Continue anticoagulation with Xarelto.  I stressed the importance of staying on this medicine to prevent stroke.  OK to have one glass of wine a day.  I explained that I did not think there was an indication for medical marijuana to ttreat the frustration he has with his speech.  Hopefully, this will improve over time.   3) aortic stenosis: Mild. No symptoms. No activity restrictions. This is the likely cause of his murmur.  4) hyperlipidemia: LDL target less than 100. Continue atorvastatin.  We'll check labs in 6 months. Will followup with him in a year.  Signed:   Fredric MareJay S. Leeroy Lovings, MD, [facility] 09/10/2013, 10:40 AM

## 2013-10-07 ENCOUNTER — Ambulatory Visit (INDEPENDENT_AMBULATORY_CARE_PROVIDER_SITE_OTHER): Payer: Medicare Other | Admitting: Nurse Practitioner

## 2013-10-07 ENCOUNTER — Encounter: Payer: Self-pay | Admitting: Nurse Practitioner

## 2013-10-07 VITALS — BP 164/91 | HR 69 | Temp 97.1°F | Ht 67.0 in | Wt 167.0 lb

## 2013-10-07 DIAGNOSIS — I6350 Cerebral infarction due to unspecified occlusion or stenosis of unspecified cerebral artery: Secondary | ICD-10-CM

## 2013-10-07 DIAGNOSIS — I639 Cerebral infarction, unspecified: Secondary | ICD-10-CM

## 2013-10-07 NOTE — Patient Instructions (Signed)
Continue xarelto ( rivaroxaban)  For atrial fibrillation and for secondary stroke prevention Maintain strict control of hypertension with blood pressure goal below 140/90, and lipids with LDL cholesterol goal below 100 mg/dL. Followup in the future in 3 months, sooner as needed.  Stroke Prevention Some medical conditions and behaviors are associated with an increased chance of having a stroke. You may prevent a stroke by making healthy choices and managing medical conditions. HOW CAN I REDUCE MY RISK OF HAVING A STROKE?   Stay physically active. Get at least 30 minutes of activity on most or all days.  Do not smoke. It may also be helpful to avoid exposure to secondhand smoke.  Limit alcohol use. Moderate alcohol use is considered to be:  No more than 2 drinks per day for men.  No more than 1 drink per day for nonpregnant women.  Eat healthy foods. This involves  Eating 5 or more servings of fruits and vegetables a day.  Following a diet that addresses high blood pressure (hypertension), high cholesterol, diabetes, or obesity.  Manage your cholesterol levels.  A diet low in saturated fat, trans fat, and cholesterol and high in fiber may control cholesterol levels.  Take any prescribed medicines to control cholesterol as directed by your health care provider.  Manage your diabetes.  A controlled-carbohydrate, controlled-sugar diet is recommended to manage diabetes.  Take any prescribed medicines to control diabetes as directed by your health care provider.  Control your hypertension.  A low-salt (sodium), low-saturated fat, low-trans fat, and low-cholesterol diet is recommended to manage hypertension.  Take any prescribed medicines to control hypertension as directed by your health care provider.  Maintain a healthy weight.  A reduced-calorie, low-sodium, low-saturated fat, low-trans fat, low-cholesterol diet is recommended to manage weight.  Stop drug abuse.  Avoid  taking birth control pills.  Talk to your health care provider about the risks of taking birth control pills if you are over 69 years old, smoke, get migraines, or have ever had a blood clot.  Get evaluated for sleep disorders (sleep apnea).  Talk to your health care provider about getting a sleep evaluation if you snore a lot or have excessive sleepiness.  Take medicines as directed by your health care provider.  For some people, aspirin or blood thinners (anticoagulants) are helpful in reducing the risk of forming abnormal blood clots that can lead to stroke. If you have the irregular heart rhythm of atrial fibrillation, you should be on a blood thinner unless there is a good reason you cannot take them.  Understand all your medicine instructions.  Make sure that other other conditions (such as anemia or atherosclerosis) are addressed. SEEK IMMEDIATE MEDICAL CARE IF:   You have sudden weakness or numbness of the face, arm, or leg, especially on one side of the body.  Your face or eyelid droops to one side.  You have sudden confusion.  You have trouble speaking (aphasia) or understanding.  You have sudden trouble seeing in one or both eyes.  You have sudden trouble walking.  You have dizziness.  You have a loss of balance or coordination.  You have a sudden, severe headache with no known cause.  You have new chest pain or an irregular heartbeat. Any of these symptoms may represent a serious problem that is an emergency. Do not wait to see if the symptoms will go away. Get medical help at once. Call your local emergency services  (911 in [place] Do not drive yourself to  the hospital. Document Released: 05/02/2004 Document Revised: 01/13/2013 Document Reviewed: 09/25/2012 [facility] Patient Information 2015 [place] [place]. This information is not intended to replace advice given to you by your health care provider. Make sure you discuss any questions you have with your health care  provider.
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
## 2013-10-08 NOTE — Progress Notes (Signed)
I agree with above 

## 2013-10-14 ENCOUNTER — Ambulatory Visit
Admission: RE | Admit: 2013-10-14 | Discharge: 2013-10-14 | Disposition: A | Discharge status: Home / Self Care | Payer: Medicare Other | Source: Ambulatory Visit | Attending: Internal Medicine | Admitting: Internal Medicine

## 2013-10-14 ENCOUNTER — Other Ambulatory Visit: Payer: Self-pay | Admitting: Internal Medicine

## 2013-10-14 DIAGNOSIS — S99919A Unspecified injury of unspecified ankle, initial encounter: Principal | ICD-10-CM

## 2013-10-14 DIAGNOSIS — S8990XA Unspecified injury of unspecified lower leg, initial encounter: Secondary | ICD-10-CM

## 2013-10-14 DIAGNOSIS — S99929A Unspecified injury of unspecified foot, initial encounter: Principal | ICD-10-CM

## 2014-01-10 ENCOUNTER — Ambulatory Visit: Payer: Medicare Other | Admitting: Neurology

## 2014-10-03 ENCOUNTER — Other Ambulatory Visit: Payer: Self-pay

## 2015-05-23 ENCOUNTER — Encounter: Payer: Self-pay | Admitting: Internal Medicine

## 2017-08-22 ENCOUNTER — Emergency Department (HOSPITAL_COMMUNITY)
Admission: EM | Admit: 2017-08-22 | Discharge: 2017-08-22 | Disposition: A | Discharge status: Home / Self Care | Payer: Medicare Other | Attending: Emergency Medicine | Admitting: Emergency Medicine

## 2017-08-22 ENCOUNTER — Encounter (HOSPITAL_COMMUNITY): Payer: Self-pay | Admitting: Emergency Medicine

## 2017-08-22 DIAGNOSIS — I83892 Varicose veins of left lower extremities with other complications: Secondary | ICD-10-CM | POA: Insufficient documentation

## 2017-08-22 DIAGNOSIS — Z7901 Long term (current) use of anticoagulants: Secondary | ICD-10-CM | POA: Diagnosis not present

## 2017-08-22 DIAGNOSIS — I83899 Varicose veins of unspecified lower extremities with other complications: Secondary | ICD-10-CM

## 2017-08-22 DIAGNOSIS — I10 Essential (primary) hypertension: Secondary | ICD-10-CM | POA: Diagnosis not present

## 2017-08-22 DIAGNOSIS — Z79899 Other long term (current) drug therapy: Secondary | ICD-10-CM | POA: Diagnosis not present

## 2017-08-22 NOTE — ED Notes (Signed)
Pt leaving department ambulatory. No available e-sign box to have patient sign. Pt voices understanding of discharge instructions. NAD

## 2017-08-22 NOTE — ED Triage Notes (Addendum)
Pt reports he is on eloquis, scratched his left knee this am and leg started bleeding profusely. small nick noted to varicose vein on left knee. Pressure bandage applied, bleeding controlled at this time.

## 2017-08-22 NOTE — ED Notes (Signed)
Dr. Jeraldine Loots aware of patient, no orders given at this time.

## 2017-08-22 NOTE — ED Provider Notes (Signed)
[facility] EMERGENCY DEPARTMENT Provider Note   CSN: [id number] Arrival date & time: 08/22/17  0522     History   Chief Complaint Chief Complaint  Patient presents with  . Coagulation Disorder      HPI Patient is a 72 year old male with history of atrial fibrillation on eliquis, HTN, CVA who presents with complaints of bleeding vein on left lower extremity.  Patient reports that he awoke this morning and scratched his leg, and had an bleeding from a superficial vein in his left lower extremity.  He denies any overt trauma to his lower extremity.  He currently denies any difficulty with ambulation, chest pain or shortness of breath.   Patient was seen by ophthalmologist on Monday for sub-conjunctival hemorrhage to his right eye.  He reports that he has been having sneezing and coughing associated with seasonal allergies.  He has close follow-up for this finding, and denies any current complaints of changes in vision, headache, pain with eye movement.  Past Medical History:  Diagnosis Date  . Atrial fibrillation (HCC)   . Essential hypertension, benign   . Hyperlipidemia   . Unspecified late effects of cerebrovascular disease     Patient Active Problem List   Diagnosis Date Noted  . Aortic valve disorders 09/10/2013  . Atrial fibrillation (HCC)   . Unspecified late effects of cerebrovascular disease   . Hyperlipidemia   . HTN (hypertension), benign   . Essential hypertension, benign   . Stroke [facility]) 06/30/2013    History reviewed. No pertinent surgical history.      Home Medications    Prior to Admission medications   Medication Sig Start Date End Date Taking? Authorizing Provider  atorvastatin (LIPITOR) 40 MG tablet Take 40 mg by mouth daily.  06/12/13  Yes [provider]  ELIQUIS 5 MG TABS tablet Take 5 mg by mouth 2 (two) times daily. 07/13/17  Yes [provider]  lisinopril (PRINIVIL,ZESTRIL) 10 MG tablet Take 10 mg by mouth  daily.   Yes [provider]  Multiple Vitamin (MULTIVITAMIN WITH MINERALS) TABS tablet Take 1 tablet by mouth daily.   Yes [provider]  vitamin A 40981 UNIT capsule Take 10,000 Units by mouth daily.   Yes [provider]  metoprolol tartrate (LOPRESSOR) 25 MG tablet 1 tablet twice a day as needed. Patient not taking: Reported on 08/22/2017 09/10/13   Corky Crafts, MD  Rivaroxaban (XARELTO) 20 MG TABS tablet Take 1 tablet (20 mg total) by mouth daily. Patient not taking: Reported on 08/22/2017 07/02/13   Rinehuls, Kinnie Scales, PA-C    Family History Family History  Problem Relation Age of Onset  . Hypertension Mother   . Cancer Mother   . Hypertension Father   . Heart attack Father     Social History Social History   Tobacco Use  . Smoking status: Never Smoker  . Smokeless tobacco: Never Used  Substance Use Topics  . Alcohol use: Yes    Comment: 1 drink daily  . Drug use: No     Allergies   Penicillins   Review of Systems Review of Systems  Constitutional: Negative for chills and fever.  HENT: Negative for ear pain and sore throat.   Eyes: Positive for redness. Negative for pain and visual disturbance.  Respiratory: Negative for cough and shortness of breath.   Cardiovascular: Negative for chest pain and palpitations.  Gastrointestinal: Negative for abdominal pain and vomiting.  Genitourinary: Negative for dysuria and hematuria.  Musculoskeletal: Negative for arthralgias and back pain.  Skin: Positive for wound. Negative for color change and rash.  Neurological: Negative for seizures and syncope.  All other systems reviewed and are negative.    Physical Exam Updated Vital Signs BP (!) 154/100 (BP Location: Right Arm)   Pulse 82   Temp 98 F (36.7 C) (Oral)   Resp 18   SpO2 100%   Physical Exam  Constitutional: He appears well-developed and well-nourished.  HENT:  Head: Normocephalic and atraumatic.  Eyes: Right conjunctiva  has a hemorrhage.  Neck: Neck supple.  Cardiovascular: Normal rate and regular rhythm.  No murmur heard. Pulmonary/Chest: Effort normal and breath sounds normal. No respiratory distress.  Abdominal: Soft. There is no tenderness.  Musculoskeletal: He exhibits no edema.  Neurological: He is alert.  Skin: Skin is warm and dry.     Superficial varicose vein with abrasion, hemostatic.   Psychiatric: He has a normal mood and affect.  Nursing note and vitals reviewed.    ED Treatments / Results  Labs (all labs ordered are listed, but only abnormal results are displayed) Labs Reviewed - No data to display  EKG None  Radiology No results found.  Procedures Procedures (including critical care time)  Medications Ordered in ED Medications - No data to display   Initial Impression / Assessment and Plan / ED Course  I have reviewed the triage vital signs and the nursing notes.  Pertinent labs & imaging results that were available during my care of the patient were reviewed by me and considered in my medical decision making (see chart for details).    Patient is a 72 year old male with history of atrial fibrillation on eliquis, HTN, CVA who presents with complaints of bleeding vein on left lower extremity.  Patient arrived hemodynamically stable, in no acute distress.  Exam as above.  Pressure dressing applied in triage, with resolution of bleeding on my examination.  Suspect superficial varicose vein that caused bleeding.  Applied Dermabond to affected area.  No further intervention required at this time.  Discussed return precautions.  Patient in agreement with plan at time of discharge.  Patient and plan of care discussed with Attending physician, Dr. Patria Mane.    Final Clinical Impressions(s) / ED Diagnoses   Final diagnoses:  Bleeding from varicose vein    ED Discharge Orders    None       Wynelle Cleveland, MD 08/22/17 1610    Azalia Bilis, MD 08/27/17 1536

## 2017-11-28 ENCOUNTER — Other Ambulatory Visit: Payer: Self-pay

## 2017-11-28 DIAGNOSIS — I83893 Varicose veins of bilateral lower extremities with other complications: Secondary | ICD-10-CM

## 2018-02-11 ENCOUNTER — Encounter: Payer: Medicare Other | Admitting: Vascular Surgery

## 2018-02-11 ENCOUNTER — Encounter (HOSPITAL_COMMUNITY): Payer: Medicare Other

## 2018-04-15 ENCOUNTER — Encounter: Payer: Medicare Other | Admitting: Vascular Surgery

## 2018-04-15 ENCOUNTER — Encounter (HOSPITAL_COMMUNITY): Payer: Medicare Other

## 2018-06-03 ENCOUNTER — Encounter (HOSPITAL_COMMUNITY): Payer: Medicare Other

## 2018-06-03 ENCOUNTER — Encounter: Payer: Medicare Other | Admitting: Vascular Surgery

## 2019-02-26 ENCOUNTER — Other Ambulatory Visit: Payer: Self-pay

## 2019-02-26 DIAGNOSIS — Z20822 Contact with and (suspected) exposure to covid-19: Secondary | ICD-10-CM

## 2019-03-01 LAB — NOVEL CORONAVIRUS, NAA: SARS-CoV-2, NAA: NOT DETECTED

## 2019-03-15 ENCOUNTER — Other Ambulatory Visit: Payer: Self-pay

## 2019-03-15 DIAGNOSIS — Z20822 Contact with and (suspected) exposure to covid-19: Secondary | ICD-10-CM

## 2019-03-16 LAB — NOVEL CORONAVIRUS, NAA: SARS-CoV-2, NAA: NOT DETECTED

## 2019-03-24 ENCOUNTER — Other Ambulatory Visit: Payer: Medicare Other

## 2019-03-26 ENCOUNTER — Ambulatory Visit: Payer: Medicare Other | Attending: Internal Medicine

## 2019-03-26 DIAGNOSIS — Z20822 Contact with and (suspected) exposure to covid-19: Secondary | ICD-10-CM

## 2019-03-27 LAB — NOVEL CORONAVIRUS, NAA: SARS-CoV-2, NAA: DETECTED — AB

## 2019-03-28 ENCOUNTER — Telehealth: Payer: Self-pay | Admitting: Unknown Physician Specialty

## 2019-03-28 ENCOUNTER — Other Ambulatory Visit: Payer: Self-pay | Admitting: Unknown Physician Specialty

## 2019-03-28 DIAGNOSIS — U071 COVID-19: Secondary | ICD-10-CM

## 2019-03-28 NOTE — Telephone Encounter (Signed)
  I connected by phone with Preston Chapman on 03/28/2019 at 12:59 PM to discuss the potential use of an new treatment for mild to moderate COVID-19 viral infection in non-hospitalized patients.  This patient is a 73 y.o. male that meets the FDA criteria for Emergency Use Authorization of bamlanivimab or casirivimab\imdevimab.  Has a (+) direct SARS-CoV-2 viral test result  Has mild or moderate COVID-19   Is ? 74 years of age and weighs ? 40 kg  Is NOT hospitalized due to COVID-19  Is NOT requiring oxygen therapy or requiring an increase in baseline oxygen flow rate due to COVID-19  Is within 10 days of symptom onset  Has at least one of the high risk factor(s) for progression to severe COVID-19 and/or hospitalization as defined in EUA.  Specific high risk criteria : >/= 74 yo   I have spoken and communicated the following to the patient or parent/caregiver:  1. FDA has authorized the emergency use of bamlanivimab and casirivimab\imdevimab for the treatment of mild to moderate COVID-19 in adults and pediatric patients with positive results of direct SARS-CoV-2 viral testing who are 33 years of age and older weighing at least 40 kg, and who are at high risk for progressing to severe COVID-19 and/or hospitalization.  2. The significant known and potential risks and benefits of bamlanivimab and casirivimab\imdevimab, and the extent to which such potential risks and benefits are unknown.  3. Information on available alternative treatments and the risks and benefits of those alternatives, including clinical trials.  4. Patients treated with bamlanivimab and casirivimab\imdevimab should continue to self-isolate and use infection control measures (e.g., wear mask, isolate, social distance, avoid sharing personal items, clean and disinfect "high touch" surfaces, and frequent handwashing) according to CDC guidelines.   5. The patient or parent/caregiver has the option to accept or refuse  bamlanivimab or casirivimab\imdevimab .  After reviewing this information with the patient, The patient agreed to proceed with receiving the bamlanimivab infusion and will be provided a copy of the Fact sheet prior to receiving the infusion.Preston Chapman 03/28/2019 12:59 PM

## 2019-03-28 NOTE — Telephone Encounter (Signed)
Called to discuss with patient about Covid symptoms and the use of bamlanivimab, a monoclonal antibody infusion for those with mild to moderate Covid symptoms and at a high risk of hospitalization.  Pt is qualified for this infusion at the [facility] due to Age > 65   Message left to call back  

## 2019-03-30 ENCOUNTER — Ambulatory Visit (HOSPITAL_COMMUNITY)
Admission: RE | Admit: 2019-03-30 | Discharge: 2019-03-30 | Disposition: A | Discharge status: Home / Self Care | Payer: Medicare Other | Source: Ambulatory Visit | Attending: Pulmonary Disease | Admitting: Pulmonary Disease

## 2019-03-30 DIAGNOSIS — U071 COVID-19: Secondary | ICD-10-CM | POA: Diagnosis not present

## 2019-03-30 DIAGNOSIS — Z23 Encounter for immunization: Secondary | ICD-10-CM | POA: Diagnosis present

## 2019-03-30 MED ORDER — METHYLPREDNISOLONE SODIUM SUCC 125 MG IJ SOLR: 125.0000 mg | Freq: Once | INTRAMUSCULAR | Status: DC | PRN

## 2019-03-30 MED ORDER — FAMOTIDINE IN NACL 20-0.9 MG/50ML-% IV SOLN: 20.0000 mg | Freq: Once | INTRAVENOUS | Status: DC | PRN

## 2019-03-30 MED ORDER — ALBUTEROL SULFATE HFA 108 (90 BASE) MCG/ACT IN AERS: 2.0000 | INHALATION_SPRAY | Freq: Once | RESPIRATORY_TRACT | Status: DC | PRN

## 2019-03-30 MED ORDER — EPINEPHRINE 0.3 MG/0.3ML IJ SOAJ: 0.3000 mg | Freq: Once | INTRAMUSCULAR | Status: DC | PRN

## 2019-03-30 MED ORDER — SODIUM CHLORIDE 0.9 % IV SOLN
INTRAVENOUS | Status: DC | PRN
  Administered 2019-03-30: 09:00:00 250 mL via INTRAVENOUS

## 2019-03-30 MED ORDER — SODIUM CHLORIDE 0.9 % IV SOLN
700.0000 mg | Freq: Once | INTRAVENOUS | Status: AC
  Administered 2019-03-30: 09:00:00 700 mg via INTRAVENOUS
  Filled 2019-03-30: qty 20

## 2019-03-30 MED ORDER — DIPHENHYDRAMINE HCL 50 MG/ML IJ SOLN: 50.0000 mg | Freq: Once | INTRAMUSCULAR | Status: DC | PRN

## 2019-03-30 NOTE — Discharge Instructions (Signed)
Prevent the Spread of COVID-19 if You Are Sick If you are sick with COVID-19 or think you might have COVID-19, follow the steps below to help protect other people in your home and community. Stay home except to get medical care.  Stay home. Most people with COVID-19 have mild illness and are able to recover at home without medical care. Do not leave your home, except to get medical care. Do not visit public areas.  Take care of yourself. Get rest and stay hydrated.  Get medical care when needed. Call your doctor before you go to their office for care. But, if you have trouble breathing or other concerning symptoms, call 911 for immediate help.  Avoid public transportation, ride-sharing, or taxis. Separate yourself from other people and pets in your home.  As much as possible, stay in a specific room and away from other people and pets in your home. Also, you should use a separate bathroom, if available. If you need to be around other people or animals in or outside of the home, wear a cloth face covering. ? See COVID-19 and Animals if you have questions about pets: https://www.cdc.gov/coronavirus/2019-ncov/faq.html#COVID19animals Monitor your symptoms.  Common symptoms of COVID-19 include fever and cough. Trouble breathing is a more serious symptom that means you should get medical attention.  Follow care instructions from your healthcare provider and local health department. Your local health authorities will give instructions on checking your symptoms and reporting information. If you develop emergency warning signs for COVID-19 get medical attention immediately.  Emergency warning signs include*:  Trouble breathing  Persistent pain or pressure in the chest  New confusion or not able to be woken  Bluish lips or face *This list is not all inclusive. Please consult your medical provider for any other symptoms that are severe or concerning to you. Call 911 if you have a medical  emergency. If you have a medical emergency and need to call 911, notify the operator that you have or think you might have, COVID-19. If possible, put on a facemask before medical help arrives. Call ahead before visiting your doctor.  Call ahead. Many medical visits for routine care are being postponed or done by phone or telemedicine.  If you have a medical appointment that cannot be postponed, call your doctor's office. This will help the office protect themselves and other patients. If you are sick, wear a cloth covering over your nose and mouth.  You should wear a cloth face covering over your nose and mouth if you must be around other people or animals, including pets (even at home).  You don't need to wear the cloth face covering if you are alone. If you can't put on a cloth face covering (because of trouble breathing for example), cover your coughs and sneezes in some other way. Try to stay at least 6 feet away from other people. This will help protect the people around you. Note: During the COVID-19 pandemic, medical grade facemasks are reserved for healthcare workers and some first responders. You may need to make a cloth face covering using a scarf or bandana. Cover your coughs and sneezes.  Cover your mouth and nose with a tissue when you cough or sneeze.  Throw used tissues in a lined trash can.  Immediately wash your hands with soap and water for at least 20 seconds. If soap and water are not available, clean your hands with an alcohol-based hand sanitizer that contains at least 60% alcohol. Clean your hands often.    Wash your hands often with soap and water for at least 20 seconds. This is especially important after blowing your nose, coughing, or sneezing; going to the bathroom; and before eating or preparing food.  Use hand sanitizer if soap and water are not available. Use an alcohol-based hand sanitizer with at least 60% alcohol, covering all surfaces of your hands and rubbing  them together until they feel dry.  Soap and water are the best option, especially if your hands are visibly dirty.  Avoid touching your eyes, nose, and mouth with unwashed hands. Avoid sharing personal household items.  Do not share dishes, drinking glasses, cups, eating utensils, towels, or bedding with other people in your home.  Wash these items thoroughly after using them with soap and water or put them in the dishwasher. Clean all "high-touch" surfaces everyday.  Clean and disinfect high-touch surfaces in your "sick room" and bathroom. Let someone else clean and disinfect surfaces in common areas, but not your bedroom and bathroom.  If a caregiver or other person needs to clean and disinfect a sick person's bedroom or bathroom, they should do so on an as-needed basis. The caregiver/other person should wear a mask and wait as long as possible after the sick person has used the bathroom. High-touch surfaces include phones, remote controls, counters, tabletops, doorknobs, bathroom fixtures, toilets, keyboards, tablets, and bedside tables.  Clean and disinfect areas that may have blood, stool, or body fluids on them.  Use household cleaners and disinfectants. Clean the area or item with soap and water or another detergent if it is dirty. Then use a household disinfectant. ? Be sure to follow the instructions on the label to ensure safe and effective use of the product. Many products recommend keeping the surface wet for several minutes to ensure germs are killed. Many also recommend precautions such as wearing gloves and making sure you have good ventilation during use of the product. ? Most EPA-registered household disinfectants should be effective. How to discontinue home isolation  People with COVID-19 who have stayed home (home isolated) can stop home isolation under the following conditions: ? If you will not have a test to determine if you are still contagious, you can leave home  after these three things have happened:  You have had no fever for at least 72 hours (that is three full days of no fever without the use of medicine that reduces fevers) AND  other symptoms have improved (for example, when your cough or shortness of breath has improved) AND  at least 10 days have passed since your symptoms first appeared. ? If you will be tested to determine if you are still contagious, you can leave home after these three things have happened:  You no longer have a fever (without the use of medicine that reduces fevers) AND  other symptoms have improved (for example, when your cough or shortness of breath has improved) AND  you received two negative tests in a row, 24 hours apart. Your doctor will follow CDC guidelines. In all cases, follow the guidance of your healthcare provider and local health department. The decision to stop home isolation should be made in consultation with your healthcare provider and state and local health departments. Local decisions depend on local circumstances. cdc.gov/coronavirus 08/09/2018 This information is not intended to replace advice given to you by your health care provider. Make sure you discuss any questions you have with your health care provider. Document Released: 07/21/2018 Document Revised: 08/19/2018 Document Reviewed: 07/21/2018   Elsevier Patient Education  2020 [employer]  

## 2019-03-30 NOTE — Progress Notes (Signed)
  Diagnosis: COVID-19  Physician: Dr.John Laurann [place]  Procedure: [facility] Med: bamlanivimab infusion - Provided patient with bamlanimivab fact sheet for patients, parents and caregivers prior to infusion.  Complications: No immediate complications noted.  Discharge: Discharged home   Janine Ores 03/30/2019

## 2019-04-05 ENCOUNTER — Other Ambulatory Visit: Payer: Medicare Other

## 2019-04-20 ENCOUNTER — Ambulatory Visit: Payer: Medicare PPO | Attending: Internal Medicine

## 2019-04-20 DIAGNOSIS — Z20822 Contact with and (suspected) exposure to covid-19: Secondary | ICD-10-CM

## 2019-04-22 LAB — NOVEL CORONAVIRUS, NAA: SARS-CoV-2, NAA: NOT DETECTED

## 2019-12-09 DIAGNOSIS — D6869 Other thrombophilia: Secondary | ICD-10-CM | POA: Diagnosis not present

## 2019-12-09 DIAGNOSIS — Z23 Encounter for immunization: Secondary | ICD-10-CM | POA: Diagnosis not present

## 2019-12-09 DIAGNOSIS — N1831 Chronic kidney disease, stage 3a: Secondary | ICD-10-CM | POA: Diagnosis not present

## 2019-12-09 DIAGNOSIS — I129 Hypertensive chronic kidney disease with stage 1 through stage 4 chronic kidney disease, or unspecified chronic kidney disease: Secondary | ICD-10-CM | POA: Diagnosis not present

## 2019-12-09 DIAGNOSIS — N400 Enlarged prostate without lower urinary tract symptoms: Secondary | ICD-10-CM | POA: Diagnosis not present

## 2019-12-09 DIAGNOSIS — Z1211 Encounter for screening for malignant neoplasm of colon: Secondary | ICD-10-CM | POA: Diagnosis not present

## 2019-12-09 DIAGNOSIS — I482 Chronic atrial fibrillation, unspecified: Secondary | ICD-10-CM | POA: Diagnosis not present

## 2019-12-09 DIAGNOSIS — Z7189 Other specified counseling: Secondary | ICD-10-CM | POA: Diagnosis not present

## 2019-12-09 DIAGNOSIS — E7800 Pure hypercholesterolemia, unspecified: Secondary | ICD-10-CM | POA: Diagnosis not present

## 2019-12-09 DIAGNOSIS — Z0000 Encounter for general adult medical examination without abnormal findings: Secondary | ICD-10-CM | POA: Diagnosis not present

## 2019-12-09 DIAGNOSIS — Z1389 Encounter for screening for other disorder: Secondary | ICD-10-CM | POA: Diagnosis not present

## 2019-12-10 ENCOUNTER — Other Ambulatory Visit: Payer: Medicare PPO

## 2020-02-08 DIAGNOSIS — Z1211 Encounter for screening for malignant neoplasm of colon: Secondary | ICD-10-CM | POA: Diagnosis not present

## 2020-12-14 DIAGNOSIS — M21612 Bunion of left foot: Secondary | ICD-10-CM | POA: Diagnosis not present

## 2020-12-14 DIAGNOSIS — L989 Disorder of the skin and subcutaneous tissue, unspecified: Secondary | ICD-10-CM | POA: Diagnosis not present

## 2020-12-14 DIAGNOSIS — N1831 Chronic kidney disease, stage 3a: Secondary | ICD-10-CM | POA: Diagnosis not present

## 2020-12-14 DIAGNOSIS — Z1389 Encounter for screening for other disorder: Secondary | ICD-10-CM | POA: Diagnosis not present

## 2020-12-14 DIAGNOSIS — D6869 Other thrombophilia: Secondary | ICD-10-CM | POA: Diagnosis not present

## 2020-12-14 DIAGNOSIS — Z23 Encounter for immunization: Secondary | ICD-10-CM | POA: Diagnosis not present

## 2020-12-14 DIAGNOSIS — Z0000 Encounter for general adult medical examination without abnormal findings: Secondary | ICD-10-CM | POA: Diagnosis not present

## 2020-12-14 DIAGNOSIS — E7800 Pure hypercholesterolemia, unspecified: Secondary | ICD-10-CM | POA: Diagnosis not present

## 2020-12-14 DIAGNOSIS — I129 Hypertensive chronic kidney disease with stage 1 through stage 4 chronic kidney disease, or unspecified chronic kidney disease: Secondary | ICD-10-CM | POA: Diagnosis not present

## 2020-12-14 DIAGNOSIS — I482 Chronic atrial fibrillation, unspecified: Secondary | ICD-10-CM | POA: Diagnosis not present

## 2020-12-14 DIAGNOSIS — M21611 Bunion of right foot: Secondary | ICD-10-CM | POA: Diagnosis not present

## 2021-02-22 DIAGNOSIS — L819 Disorder of pigmentation, unspecified: Secondary | ICD-10-CM | POA: Diagnosis not present

## 2021-02-22 DIAGNOSIS — L821 Other seborrheic keratosis: Secondary | ICD-10-CM | POA: Diagnosis not present

## 2021-02-22 DIAGNOSIS — D492 Neoplasm of unspecified behavior of bone, soft tissue, and skin: Secondary | ICD-10-CM | POA: Diagnosis not present

## 2021-04-24 DIAGNOSIS — D225 Melanocytic nevi of trunk: Secondary | ICD-10-CM | POA: Diagnosis not present

## 2021-04-24 DIAGNOSIS — C44529 Squamous cell carcinoma of skin of other part of trunk: Secondary | ICD-10-CM | POA: Diagnosis not present

## 2021-04-24 DIAGNOSIS — L570 Actinic keratosis: Secondary | ICD-10-CM | POA: Diagnosis not present

## 2021-04-24 DIAGNOSIS — D492 Neoplasm of unspecified behavior of bone, soft tissue, and skin: Secondary | ICD-10-CM | POA: Diagnosis not present

## 2021-04-24 DIAGNOSIS — L821 Other seborrheic keratosis: Secondary | ICD-10-CM | POA: Diagnosis not present

## 2021-04-24 DIAGNOSIS — L814 Other melanin hyperpigmentation: Secondary | ICD-10-CM | POA: Diagnosis not present

## 2021-04-24 DIAGNOSIS — L819 Disorder of pigmentation, unspecified: Secondary | ICD-10-CM | POA: Diagnosis not present

## 2021-04-24 DIAGNOSIS — I8393 Asymptomatic varicose veins of bilateral lower extremities: Secondary | ICD-10-CM | POA: Diagnosis not present

## 2021-04-26 DIAGNOSIS — F418 Other specified anxiety disorders: Secondary | ICD-10-CM | POA: Diagnosis not present

## 2021-05-30 DIAGNOSIS — F418 Other specified anxiety disorders: Secondary | ICD-10-CM | POA: Diagnosis not present

## 2021-06-07 DIAGNOSIS — L570 Actinic keratosis: Secondary | ICD-10-CM | POA: Diagnosis not present

## 2021-07-23 DIAGNOSIS — L570 Actinic keratosis: Secondary | ICD-10-CM | POA: Diagnosis not present

## 2021-11-23 DIAGNOSIS — Z85828 Personal history of other malignant neoplasm of skin: Secondary | ICD-10-CM | POA: Diagnosis not present

## 2021-11-23 DIAGNOSIS — L821 Other seborrheic keratosis: Secondary | ICD-10-CM | POA: Diagnosis not present

## 2021-11-23 DIAGNOSIS — L570 Actinic keratosis: Secondary | ICD-10-CM | POA: Diagnosis not present

## 2021-11-23 DIAGNOSIS — L814 Other melanin hyperpigmentation: Secondary | ICD-10-CM | POA: Diagnosis not present

## 2021-11-23 DIAGNOSIS — D225 Melanocytic nevi of trunk: Secondary | ICD-10-CM | POA: Diagnosis not present

## 2021-11-23 DIAGNOSIS — Z08 Encounter for follow-up examination after completed treatment for malignant neoplasm: Secondary | ICD-10-CM | POA: Diagnosis not present

## 2021-12-19 DIAGNOSIS — I69320 Aphasia following cerebral infarction: Secondary | ICD-10-CM | POA: Diagnosis not present

## 2021-12-19 DIAGNOSIS — I482 Chronic atrial fibrillation, unspecified: Secondary | ICD-10-CM | POA: Diagnosis not present

## 2021-12-19 DIAGNOSIS — Z23 Encounter for immunization: Secondary | ICD-10-CM | POA: Diagnosis not present

## 2021-12-19 DIAGNOSIS — D6869 Other thrombophilia: Secondary | ICD-10-CM | POA: Diagnosis not present

## 2021-12-19 DIAGNOSIS — E7800 Pure hypercholesterolemia, unspecified: Secondary | ICD-10-CM | POA: Diagnosis not present

## 2021-12-19 DIAGNOSIS — Z1331 Encounter for screening for depression: Secondary | ICD-10-CM | POA: Diagnosis not present

## 2021-12-19 DIAGNOSIS — I129 Hypertensive chronic kidney disease with stage 1 through stage 4 chronic kidney disease, or unspecified chronic kidney disease: Secondary | ICD-10-CM | POA: Diagnosis not present

## 2021-12-19 DIAGNOSIS — Z0000 Encounter for general adult medical examination without abnormal findings: Secondary | ICD-10-CM | POA: Diagnosis not present

## 2021-12-19 DIAGNOSIS — F418 Other specified anxiety disorders: Secondary | ICD-10-CM | POA: Diagnosis not present

## 2021-12-19 DIAGNOSIS — N1831 Chronic kidney disease, stage 3a: Secondary | ICD-10-CM | POA: Diagnosis not present

## 2023-02-04 DIAGNOSIS — Z08 Encounter for follow-up examination after completed treatment for malignant neoplasm: Secondary | ICD-10-CM | POA: Diagnosis not present

## 2023-02-04 DIAGNOSIS — L814 Other melanin hyperpigmentation: Secondary | ICD-10-CM | POA: Diagnosis not present

## 2023-02-04 DIAGNOSIS — L821 Other seborrheic keratosis: Secondary | ICD-10-CM | POA: Diagnosis not present

## 2023-02-04 DIAGNOSIS — L570 Actinic keratosis: Secondary | ICD-10-CM | POA: Diagnosis not present

## 2023-02-04 DIAGNOSIS — D225 Melanocytic nevi of trunk: Secondary | ICD-10-CM | POA: Diagnosis not present

## 2023-02-04 DIAGNOSIS — Z85828 Personal history of other malignant neoplasm of skin: Secondary | ICD-10-CM | POA: Diagnosis not present

## 2023-02-10 DIAGNOSIS — N1831 Chronic kidney disease, stage 3a: Secondary | ICD-10-CM | POA: Diagnosis not present

## 2023-02-10 DIAGNOSIS — I482 Chronic atrial fibrillation, unspecified: Secondary | ICD-10-CM | POA: Diagnosis not present

## 2023-02-10 DIAGNOSIS — Z0000 Encounter for general adult medical examination without abnormal findings: Secondary | ICD-10-CM | POA: Diagnosis not present

## 2023-02-10 DIAGNOSIS — Z1331 Encounter for screening for depression: Secondary | ICD-10-CM | POA: Diagnosis not present

## 2023-02-10 DIAGNOSIS — Z9181 History of falling: Secondary | ICD-10-CM | POA: Diagnosis not present

## 2023-02-10 DIAGNOSIS — I129 Hypertensive chronic kidney disease with stage 1 through stage 4 chronic kidney disease, or unspecified chronic kidney disease: Secondary | ICD-10-CM | POA: Diagnosis not present

## 2023-02-10 DIAGNOSIS — I69320 Aphasia following cerebral infarction: Secondary | ICD-10-CM | POA: Diagnosis not present

## 2023-02-10 DIAGNOSIS — Z23 Encounter for immunization: Secondary | ICD-10-CM | POA: Diagnosis not present

## 2023-02-10 DIAGNOSIS — I10 Essential (primary) hypertension: Secondary | ICD-10-CM | POA: Diagnosis not present

## 2023-02-10 DIAGNOSIS — D6869 Other thrombophilia: Secondary | ICD-10-CM | POA: Diagnosis not present

## 2023-02-10 DIAGNOSIS — F418 Other specified anxiety disorders: Secondary | ICD-10-CM | POA: Diagnosis not present

## 2023-02-10 DIAGNOSIS — E7800 Pure hypercholesterolemia, unspecified: Secondary | ICD-10-CM | POA: Diagnosis not present

## 2023-04-11 ENCOUNTER — Ambulatory Visit: Payer: Medicare PPO | Attending: Cardiology | Admitting: Cardiology

## 2023-04-11 ENCOUNTER — Encounter: Payer: Self-pay | Admitting: Cardiology

## 2023-04-11 VITALS — BP 140/80 | HR 72 | Resp 16 | Ht 67.0 in | Wt 154.2 lb

## 2023-04-11 DIAGNOSIS — I4811 Longstanding persistent atrial fibrillation: Secondary | ICD-10-CM | POA: Diagnosis not present

## 2023-04-11 DIAGNOSIS — Z8673 Personal history of transient ischemic attack (TIA), and cerebral infarction without residual deficits: Secondary | ICD-10-CM

## 2023-04-11 DIAGNOSIS — R011 Cardiac murmur, unspecified: Secondary | ICD-10-CM | POA: Diagnosis not present

## 2023-04-11 DIAGNOSIS — D6869 Other thrombophilia: Secondary | ICD-10-CM

## 2023-04-11 DIAGNOSIS — I10 Essential (primary) hypertension: Secondary | ICD-10-CM | POA: Diagnosis not present

## 2023-04-11 DIAGNOSIS — I4891 Unspecified atrial fibrillation: Secondary | ICD-10-CM | POA: Diagnosis not present

## 2023-04-11 DIAGNOSIS — E782 Mixed hyperlipidemia: Secondary | ICD-10-CM

## 2023-04-11 DIAGNOSIS — Z7901 Long term (current) use of anticoagulants: Secondary | ICD-10-CM

## 2023-04-11 DIAGNOSIS — I4819 Other persistent atrial fibrillation: Secondary | ICD-10-CM

## 2023-04-11 MED ORDER — METOPROLOL SUCCINATE ER 25 MG PO TB24: 25.0000 mg | ORAL_TABLET | Freq: Every day | ORAL | 3 refills | Status: DC

## 2023-04-11 NOTE — Patient Instructions (Signed)
 Medication Instructions:  Your physician has recommended you make the following change in your medication:   START Metoprolol  Succinate (Toprol  XL) 25 mg once daily  HOLD if systolic blood pressure is less than 100 and/or heart rate is less than 55   *If you need a refill on your cardiac medications before your next appointment, please call your pharmacy*  Lab Work: None ordered today. If you have labs (blood work) drawn today and your tests are completely normal, you will receive your results only by: MyChart Message (if you have MyChart) OR A paper copy in the mail If you have any lab test that is abnormal or we need to change your treatment, we will call you to review the results.  Testing/Procedures: Your physician has requested that you have an echocardiogram. Echocardiography is a painless test that uses sound waves to create images of your heart. It provides your doctor with information about the size and shape of your heart and how well your heart's chambers and valves are working. This procedure takes approximately one hour. There are no restrictions for this procedure. Please do NOT wear cologne, perfume, aftershave, or lotions (deodorant is allowed). Please arrive 15 minutes prior to your appointment time.  Please note: We ask at that you not bring children with you during ultrasound (echo/ vascular) testing. Due to room size and safety concerns, children are not allowed in the ultrasound rooms during exams. Our front office staff cannot provide observation of children in our lobby area while testing is being conducted. An adult accompanying a patient to their appointment will only be allowed in the ultrasound room at the discretion of the ultrasound technician under special circumstances. We apologize for any inconvenience.   Follow-Up: At [facility], you and your health needs are our priority.  As part of our continuing mission to provide you with exceptional heart care, we  have created designated Provider Care Teams.  These Care Teams include your primary Cardiologist (physician) and Advanced Practice Providers (APPs -  Physician Assistants and Nurse Practitioners) who all work together to provide you with the care you need, when you need it.  We recommend signing up for the patient portal called MyChart.  Sign up information is provided on this After Visit Summary.  MyChart is used to connect with patients for Virtual Visits (Telemedicine).  Patients are able to view lab/test results, encounter notes, upcoming appointments, etc.  Non-urgent messages can be sent to your provider as well.   To learn more about what you can do with MyChart, go to [email or web address].    Your next appointment:   1 year(s)  The format for your next appointment:   In Person  Provider:   Madonna Large, DO {

## 2023-04-11 NOTE — Progress Notes (Signed)
 Cardiology Office Note:    NAME:  Preston Chapman    MRN: [id number] DOB:  10/13/1945   PCP:  Signa Rush, MD (Inactive)  Former Cardiology Providers: N/A Primary Cardiologist:  Madonna Large, DO, [facility] (established care 04/11/2023) Electrophysiologist:  None   Referring MD: Elliot Charm,*  Reason of Consult: Atrial fibrillation  Chief Complaint  Patient presents with   Atrial Fibrillation   New Patient (Initial Visit)    History of Present Illness:    Preston Chapman is a 78 y.o. Caucasian male whose past medical history and cardiovascular risk factors includes: Chronic atrial fibrillation, history of stroke, aortic stenosis, hypertension, hyperlipidemia. He is being seen today for the evaluation of atrial fibrillation at the request of Varadarajan, Rupashree,*.  Diagnosed with atrial fibrillation in 2014 and since then has been on rate control strategy as well as anticoagulation for thromboembolic prophylaxis.  Initially he was on Xarelto  and currently on Eliquis for the last several years.  His home pulse usually ranges between 62-65 bpm.  No prior history of bleeding related to atrial fibrillation.  He denies any anginal chest pain or heart failure symptoms.  No prior history of direct-current cardioversion, ablation  Overall very active for age: Enjoys going to 2 separate gyms.  Walks 3 miles on the treadmill and light weights as well as stationary bike.    Current Medications: Current Meds  Medication Sig   atorvastatin  (LIPITOR) 40 MG tablet Take 40 mg by mouth daily.    ELIQUIS 5 MG TABS tablet Take 5 mg by mouth 2 (two) times daily.   lisinopril  (PRINIVIL ,ZESTRIL ) 10 MG tablet Take 10 mg by mouth daily.   metoprolol  succinate (TOPROL  XL) 25 MG 24 hr tablet Take 1 tablet (25 mg total) by mouth daily. HOLD if systolic blood pressure is less than 100 and/or heart rate is less than 55   Multiple Vitamin (MULTIVITAMIN WITH MINERALS) TABS tablet Take 1 tablet by mouth  daily.   sertraline (ZOLOFT) 50 MG tablet Take 50 mg by mouth daily.     Allergies:    Penicillins   Past Medical History: Past Medical History:  Diagnosis Date   Atrial fibrillation (HCC)    Essential hypertension, benign    Hyperlipidemia    Unspecified late effects of cerebrovascular disease     Past Surgical History: Past Surgical History:  Procedure Laterality Date   CATARACT EXTRACTION Bilateral     Social History: Social History   Tobacco Use   Smoking status: Never   Smokeless tobacco: Never  Substance Use Topics   Alcohol use: Yes    Comment: 1 drink daily   Drug use: No    Family History: Family History  Problem Relation Age of Onset   Hypertension Mother    Cancer Mother    Hypertension Father    Heart attack Father     ROS:   Review of Systems  Cardiovascular:  Negative for chest pain, claudication, irregular heartbeat, leg swelling, near-syncope, orthopnea, palpitations, paroxysmal nocturnal dyspnea and syncope.  Respiratory:  Negative for shortness of breath.   Hematologic/Lymphatic: Negative for bleeding problem.    EKGs/Labs/Other Studies Reviewed:   EKG: EKG Interpretation Date/Time:  Friday April 11 2023 10:07:05 EST Ventricular Rate:  79 PR Interval:    QRS Duration:  110 QT Interval:  390 QTC Calculation: 447 R Axis:   33  Text Interpretation: Atrial fibrillation Minimal voltage criteria for LVH, may be normal variant ( Cornell product ) Nonspecific ST abnormality  When compared with ECG of 30-Jun-2013 15:35, Atrial fibrillation has replaced Normal sinus rhythm Confirmed by Michele Richardson [phone]) on 04/11/2023 10:12:27 AM  Echocardiogram: 06/2013 Left ventricle: Their appears to be mid cavity SAM of the    mitral valve and Mild mid septal hypertrophy. The cavity    size was normal. There was moderate concentric    hypertrophy. Systolic function was normal. The estimated    ejection fraction was in the range of 60% to 65%. Wall     motion was normal; there were no regional wall motion    abnormalities. Doppler parameters are consistent with    abnormal left ventricular relaxation (grade 1 diastolic    dysfunction).  - Aortic valve: Transvalvular velocity was increased. There    was very mild stenosis. Mild regurgitation.  - Mitral valve: Mild regurgitation.  - Left atrium: The atrium was mildly dilated.  - Right ventricle: The cavity size was mildly dilated. Wall    thickness was normal.  - Right atrium: The atrium was mildly dilated.   Labs: External labs provided by PCP Collected 11//2024 Total cholesterol 165, triglycerides 99, HDL 63, calculated LDL 84, non-HDL 102 BUN 38, creatinine 1.37. eGFR 53. Sodium 135, potassium 5.3, chloride 99, bicarb 32. AST, ALT, alkaline phosphatase within normal limits. Hemoglobin 15.4  Physical Exam:    Today's Vitals   04/11/23 1003  BP: (!) 140/80  Pulse: 72  Resp: 16  SpO2: 97%  Weight: 154 lb 3.2 oz (69.9 kg)  Height: 5' 7 (1.702 m)   Body mass index is 24.15 kg/m. Wt Readings from Last 3 Encounters:  04/11/23 154 lb 3.2 oz (69.9 kg)  10/07/13 167 lb (75.8 kg)  09/10/13 165 lb (74.8 kg)    Physical Exam  Constitutional: No distress.  hemodynamically stable  Neck: No JVD present.  Cardiovascular: Normal rate, regular rhythm, S1 normal and S2 normal. Exam reveals no gallop, no S3 and no S4.  No murmur heard. Pulmonary/Chest: Effort normal and breath sounds normal. No stridor. He has no wheezes. He has no rales.  Abdominal: Soft. Bowel sounds are normal. He exhibits no distension. There is no abdominal tenderness.  Musculoskeletal:        General: No edema.     Cervical back: Neck supple.  Neurological: He is alert and oriented to person, place, and time. He has intact cranial nerves (2-12).  Skin: Skin is warm.     Impression & Recommendation(s):  Impression:   ICD-10-CM   1. Persistent atrial fibrillation (HCC)  I48.19 EKG 12-Lead    metoprolol   succinate (TOPROL  XL) 25 MG 24 hr tablet    2. Hypercoagulable state due to longstanding persistent atrial fibrillation (HCC)  D68.69    I48.11     3. Long term (current) use of anticoagulants  Z79.01     4. Cardiac murmur  R01.1 ECHOCARDIOGRAM COMPLETE    5. History of stroke  Z86.73     6. Benign hypertension  I10     7. Mixed hyperlipidemia  E78.2        Recommendation(s):  Persistent atrial fibrillation (HCC) Hypercoagulable state due to longstanding persistent atrial fibrillation (HCC) Long term (current) use of anticoagulants Diagnosed with atrial fibrillation in 2014. Rates are well-controlled at home. No prior history of direct-current cardioversion or atrial fibrillation ablation.  Patient prefers current management. Rate control: Transition Lopressor  to Toprol -XL. Rhythm control: N/A Anticoagulation: Eliquis In the past patient needs to take Lopressor  25 mg p.o. twice daily as needed.  To make pharmacological therapy more simpler transition to Toprol -XL 25 mg p.o. daily  Cardiac murmur Echo will be ordered to evaluate for structural heart disease and left ventricular systolic function.  Benign hypertension Initial blood pressures were not well-controlled. On recheck blood pressure was 125/80. Currently on lisinopril  10 mg p.o. daily Currently managed by primary care provider. Reemphasized importance of low-salt diet.  Mixed hyperlipidemia Currently on Lipitor 40 mg p.o. daily and.   He denies myalgia or other side effects. Most recent lipids dated November 2024 reviewed as noted above.  Orders Placed:  Orders Placed This Encounter  Procedures   EKG 12-Lead   ECHOCARDIOGRAM COMPLETE    Standing Status:   Future    Expected Date:   04/18/2023    Expiration Date:   04/10/2024    Where should this test be performed:   [facility])    Does the patient weigh less than or greater than 250 lbs?:   Patient weighs less than 250 lbs     Perflutren DEFINITY (image enhancing agent) should be administered unless hypersensitivity or allergy exist:   Administer Perflutren    Reason for exam-Echo:   Other-Full Diagnosis List    Full ICD-10/Reason for Exam:   Cardiac murmur [197301]    Final Medication List:    Meds ordered this encounter  Medications   metoprolol  succinate (TOPROL  XL) 25 MG 24 hr tablet    Sig: Take 1 tablet (25 mg total) by mouth daily. HOLD if systolic blood pressure is less than 100 and/or heart rate is less than 55    Dispense:  90 tablet    Refill:  3    Medications Discontinued During This Encounter  Medication Reason   vitamin A 10000 UNIT capsule Patient Preference   Rivaroxaban  (XARELTO ) 20 MG TABS tablet Change in therapy   metoprolol  tartrate (LOPRESSOR ) 25 MG tablet Patient Preference     Current Outpatient Medications:    atorvastatin  (LIPITOR) 40 MG tablet, Take 40 mg by mouth daily. , Disp: , Rfl:    ELIQUIS 5 MG TABS tablet, Take 5 mg by mouth 2 (two) times daily., Disp: , Rfl: 3   lisinopril  (PRINIVIL ,ZESTRIL ) 10 MG tablet, Take 10 mg by mouth daily., Disp: , Rfl:    metoprolol  succinate (TOPROL  XL) 25 MG 24 hr tablet, Take 1 tablet (25 mg total) by mouth daily. HOLD if systolic blood pressure is less than 100 and/or heart rate is less than 55, Disp: 90 tablet, Rfl: 3   Multiple Vitamin (MULTIVITAMIN WITH MINERALS) TABS tablet, Take 1 tablet by mouth daily., Disp: , Rfl:    sertraline (ZOLOFT) 50 MG tablet, Take 50 mg by mouth daily., Disp: , Rfl:   Consent:   NA  Disposition:   1 year sooner if needed Patient may be asked to follow-up sooner based on the results of the above-mentioned testing.  His questions and concerns were addressed to his satisfaction. He voices understanding of the recommendations provided during this encounter.    Signed, Madonna Large, DO, [facility]  [facility]  [address] [place], [place] [address] 04/20/2023 10:12 PM

## 2023-04-20 ENCOUNTER — Encounter: Payer: Self-pay | Admitting: Cardiology

## 2023-08-07 DIAGNOSIS — L570 Actinic keratosis: Secondary | ICD-10-CM | POA: Diagnosis not present

## 2023-08-21 DIAGNOSIS — F411 Generalized anxiety disorder: Secondary | ICD-10-CM | POA: Diagnosis not present

## 2023-08-21 DIAGNOSIS — N1831 Chronic kidney disease, stage 3a: Secondary | ICD-10-CM | POA: Diagnosis not present

## 2023-08-21 DIAGNOSIS — D6869 Other thrombophilia: Secondary | ICD-10-CM | POA: Diagnosis not present

## 2023-08-21 DIAGNOSIS — I482 Chronic atrial fibrillation, unspecified: Secondary | ICD-10-CM | POA: Diagnosis not present

## 2023-08-21 DIAGNOSIS — I10 Essential (primary) hypertension: Secondary | ICD-10-CM | POA: Diagnosis not present

## 2023-10-02 ENCOUNTER — Ambulatory Visit (HOSPITAL_COMMUNITY)
Admission: RE | Admit: 2023-10-02 | Discharge: 2023-10-02 | Disposition: A | Discharge status: Home / Self Care | Payer: Medicare PPO | Source: Ambulatory Visit | Attending: Cardiovascular Disease | Admitting: Cardiovascular Disease

## 2023-10-02 DIAGNOSIS — R011 Cardiac murmur, unspecified: Secondary | ICD-10-CM

## 2023-10-02 LAB — ECHOCARDIOGRAM COMPLETE
AR max vel: 1.97 cm2
AV Area VTI: 2.18 cm2
AV Area mean vel: 2.27 cm2
AV Mean grad: 11 mmHg
AV Peak grad: 25 mmHg
Ao pk vel: 2.5 m/s
Area-P 1/2: 6.51 cm2
MV M vel: 6.16 m/s
MV Peak grad: 151.8 mmHg
P 1/2 time: 339 ms
Radius: 0.7 cm
S' Lateral: 3.3 cm

## 2023-10-09 ENCOUNTER — Ambulatory Visit: Payer: Self-pay | Admitting: Cardiology

## 2023-11-05 ENCOUNTER — Encounter: Payer: Self-pay | Admitting: Cardiology

## 2023-11-05 ENCOUNTER — Ambulatory Visit: Attending: Cardiology | Admitting: Cardiology

## 2023-11-05 VITALS — BP 170/80 | HR 70 | Resp 16 | Ht 67.0 in | Wt 152.0 lb

## 2023-11-05 DIAGNOSIS — I359 Nonrheumatic aortic valve disorder, unspecified: Secondary | ICD-10-CM | POA: Diagnosis not present

## 2023-11-05 DIAGNOSIS — D6869 Other thrombophilia: Secondary | ICD-10-CM | POA: Diagnosis not present

## 2023-11-05 DIAGNOSIS — I4819 Other persistent atrial fibrillation: Secondary | ICD-10-CM | POA: Diagnosis not present

## 2023-11-05 DIAGNOSIS — Z8673 Personal history of transient ischemic attack (TIA), and cerebral infarction without residual deficits: Secondary | ICD-10-CM

## 2023-11-05 DIAGNOSIS — I10 Essential (primary) hypertension: Secondary | ICD-10-CM | POA: Diagnosis not present

## 2023-11-05 DIAGNOSIS — Z7901 Long term (current) use of anticoagulants: Secondary | ICD-10-CM

## 2023-11-05 DIAGNOSIS — I059 Rheumatic mitral valve disease, unspecified: Secondary | ICD-10-CM | POA: Diagnosis not present

## 2023-11-05 DIAGNOSIS — I4811 Longstanding persistent atrial fibrillation: Secondary | ICD-10-CM | POA: Diagnosis not present

## 2023-11-05 DIAGNOSIS — E782 Mixed hyperlipidemia: Secondary | ICD-10-CM

## 2023-11-05 MED ORDER — LISINOPRIL 20 MG PO TABS: 20.0000 mg | ORAL_TABLET | Freq: Every day | ORAL | 3 refills | Status: DC

## 2023-11-05 MED ORDER — METOPROLOL SUCCINATE ER 25 MG PO TB24: 25.0000 mg | ORAL_TABLET | Freq: Every morning | ORAL | Status: DC

## 2023-11-05 NOTE — Progress Notes (Signed)
 Cardiology Office Note:    NAME:  Preston Chapman    MRN: [id number] DOB:  1946-01-23   PCP:  Signa Rush, MD (Inactive)  Former Cardiology Providers: N/A Primary Cardiologist:  Madonna Large, DO, [facility] (established care 04/11/2023) Electrophysiologist:  None    Chief Complaint  Patient presents with   Atrial Fibrillation   Follow-up    Discuss Echo results    History of Present Illness:    Preston Chapman is a 78 y.o. Caucasian male whose past medical history and cardiovascular risk factors includes: Chronic atrial fibrillation, history of stroke, aortic stenosis, hypertension, hyperlipidemia.   Patient was referred to the practice for evaluation and management of atrial fibrillation.    According to him he was diagnosed with A-fib in 2014 and since then has been on rate control strategy and anticoagulation for thromboembolic prophylaxis.  Initially he was on Xarelto  and later transitioned to Eliquis which she has been tolerating for several years.    Patient last seen in the office in January 2025.  At that time his Lopressor  was transitioned to Toprol -XL.  Given his cardiac murmur on physical examination recommended an echocardiogram.  Patient presents today for follow-up. He does not endorse any evidence of bleeding. Remains in A-fib with controlled ventricular rate. His home blood pressures predominantly less than 130 mmHg.   Office blood pressure still remain elevated on repeat check. Results of the echo reviewed with him in detail and mentioned below for further reference No change in overall physical endurance. Overall very active for age: Enjoys going to 2 separate gyms.  Walks 3 miles on the treadmill and light weights as well as stationary bike.    Current Medications: Current Meds  Medication Sig   atorvastatin  (LIPITOR) 40 MG tablet Take 40 mg by mouth daily.    ELIQUIS 5 MG TABS tablet Take 5 mg by mouth 2 (two) times daily.   lisinopril  (ZESTRIL ) 20 MG tablet Take  1 tablet (20 mg total) by mouth daily.   Multiple Vitamin (MULTIVITAMIN WITH MINERALS) TABS tablet Take 1 tablet by mouth daily.   sertraline (ZOLOFT) 50 MG tablet Take 50 mg by mouth daily.   [DISCONTINUED] lisinopril  (PRINIVIL ,ZESTRIL ) 10 MG tablet Take 10 mg by mouth daily.   [DISCONTINUED] metoprolol  succinate (TOPROL  XL) 25 MG 24 hr tablet Take 1 tablet (25 mg total) by mouth daily. HOLD if systolic blood pressure is less than 100 and/or heart rate is less than 55     Allergies:    Penicillins   Past Medical History: Past Medical History:  Diagnosis Date   Atrial fibrillation (HCC)    Essential hypertension, benign    Hyperlipidemia    Unspecified late effects of cerebrovascular disease     Past Surgical History: Past Surgical History:  Procedure Laterality Date   CATARACT EXTRACTION Bilateral     Social History: Social History   Tobacco Use   Smoking status: Never   Smokeless tobacco: Never  Substance Use Topics   Alcohol use: Yes    Comment: 1 drink daily   Drug use: No    Family History: Family History  Problem Relation Age of Onset   Hypertension Mother    Cancer Mother    Hypertension Father    Heart attack Father     ROS:   Review of Systems  Cardiovascular:  Negative for chest pain, claudication, irregular heartbeat, leg swelling, near-syncope, orthopnea, palpitations, paroxysmal nocturnal dyspnea and syncope.  Respiratory:  Negative for shortness of  breath.   Hematologic/Lymphatic: Negative for bleeding problem.    EKGs/Labs/Other Studies Reviewed:   EKG: EKG Interpretation Date/Time:  Wednesday November 05 2023 08:07:21 EDT Ventricular Rate:  65 PR Interval:    QRS Duration:  106 QT Interval:  412 QTC Calculation: 428 R Axis:   117  Text Interpretation: Atrial fibrillation CONTROLLED VENTRICULAR RESPONSE Right axis deviation Nonspecific ST abnormality When compared with ECG of 11-Apr-2023 10:07, No significant change since last tracing  Confirmed by Michele Richardson [phone]) on 11/05/2023 8:14:30 AM  Echocardiogram: 09/2023 1. Left ventricular ejection fraction, by estimation, is 60 to 65%. The left ventricle has normal function. The left ventricle has no regional wall motion abnormalities. There is moderate concentric left ventricular hypertrophy of the basal-septal  segment. 2. Right ventricular systolic function is normal. The right ventricular size is normal. There is normal pulmonary artery systolic pressure. 3. Left atrial size was severely dilated. 4. Right atrial size was severely dilated. 5. The mitral valve is normal in structure. Moderate to severe mitral valve regurgitation. No evidence of mitral stenosis. 6. The aortic valve is calcified and degenerative. Aortic valve regurgitation is severe. No aortic stenosis is present. 7. Aortic dilatation noted. There is mild dilatation of the ascending aorta, measuring 42 mm. 8. The inferior vena cava is normal in size with greater than 50% respiratory variability, suggesting right atrial pressure of 3 mmHg.   Labs: External labs provided by PCP Collected 11//2024 Total cholesterol 165, triglycerides 99, HDL 63, calculated LDL 84, non-HDL 102 BUN 38, creatinine 1.37. eGFR 53. Sodium 135, potassium 5.3, chloride 99, bicarb 32. AST, ALT, alkaline phosphatase within normal limits. Hemoglobin 15.4    Physical Exam:    Today's Vitals   11/05/23 0755 11/05/23 0833  BP: (!) 184/78 (!) 170/80  Pulse: 76 70  Resp: 16   SpO2: 98%   Weight: 152 lb (68.9 kg)   Height: 5' 7 (1.702 m)    Body mass index is 23.81 kg/m. Wt Readings from Last 3 Encounters:  11/05/23 152 lb (68.9 kg)  04/11/23 154 lb 3.2 oz (69.9 kg)  10/07/13 167 lb (75.8 kg)    Physical Exam  Constitutional: No distress.  hemodynamically stable  Neck: No JVD present.  Cardiovascular: Normal rate, regular rhythm, S1 normal and S2 normal. Exam reveals no gallop, no S3 and no S4.  Murmur  heard. High-pitched blowing holosystolic murmur is present with a grade of 3/6 at the apex. Blowing decrescendo early diastolic murmur is present with a grade of 3/4 at the upper right sternal border radiating to the apex. Pulmonary/Chest: Effort normal and breath sounds normal. No stridor. He has no wheezes. He has no rales.  Abdominal: Soft. Bowel sounds are normal. He exhibits no distension. There is no abdominal tenderness.  Musculoskeletal:        General: No edema.     Cervical back: Neck supple.  Neurological: He is alert and oriented to person, place, and time. He has intact cranial nerves (2-12).  Skin: Skin is warm.     Impression & Recommendation(s):  Impression:   ICD-10-CM   1. Persistent atrial fibrillation (HCC)  I48.19 EKG 12-Lead    metoprolol  succinate (TOPROL  XL) 25 MG 24 hr tablet    2. Hypercoagulable state due to longstanding persistent atrial fibrillation (HCC)  D68.69    I48.11     3. Long term (current) use of anticoagulants  Z79.01     4. Aortic valve disease  I35.9     5.  Mitral valve disease  I05.9     6. History of stroke  Z86.73     7. Benign hypertension  I10 AMB Referral to [facility] Pharm-D    Basic metabolic panel with GFR    lisinopril  (ZESTRIL ) 20 MG tablet    Basic metabolic panel with GFR    8. Mixed hyperlipidemia  E78.2        Recommendation(s):  Persistent atrial fibrillation (HCC) Hypercoagulable state due to longstanding persistent atrial fibrillation (HCC) Long term (current) use of anticoagulants Diagnosed with atrial fibrillation in 2014. No prior history of direct-current cardioversion or atrial fibrillation ablation.  Patient prefers medical management. Rate control: Toprol -XL. Rhythm control: N/A Anticoagulation: Eliquis Does not endorse evidence of bleeding.  Moderate to severe mitral regurgitation. Severe aortic regurgitation Echocardiogram was performed as he was noted to have a cardiac murmur on physical  examination. Most recent echo notes mitral and aortic regurgitation. Recommended TEE for further evaluation and management. Discussed the risks, benefits, and alternatives. Patient states that he would like to discuss this further with his wife.  Just went to [place] earlier this week but will be back in the next week or so.  He will call us  back so that we can have a discussion over the phone to schedule this forward.  Benign hypertension Office blood pressures remain elevated. Home blood pressures seem to be more acceptable. Given the findings of valvular heart disease recommend better blood pressure management without over correcting it. Recommend taking Toprol -XL at the current dose of 25 mg in the morning. Will increase lisinopril  from 10 mg to 20 mg at night. BMP in one week to check renal function and electrolytes We will also refer him to Pharm.D. clinic for close monitoring of BP and up titration as needed  Mixed hyperlipidemia Currently on Lipitor 40 mg p.o. daily and.   He denies myalgia or other side effects. Most recent lipids dated November 2024 reviewed as noted above.  Orders Placed:  Orders Placed This Encounter  Procedures   Basic metabolic panel with GFR    Standing Status:   Future    Number of Occurrences:   1    Expected Date:   11/12/2023    Expiration Date:   11/04/2024   AMB Referral to [facility] Pharm-D    Referral Priority:   Routine    Referral Type:   Consultation    Referral Reason:   Specialty Services Required    Number of Visits Requested:   1   EKG 12-Lead    Final Medication List:    Meds ordered this encounter  Medications   lisinopril  (ZESTRIL ) 20 MG tablet    Sig: Take 1 tablet (20 mg total) by mouth daily.    Dispense:  30 tablet    Refill:  3   metoprolol  succinate (TOPROL  XL) 25 MG 24 hr tablet    Sig: Take 1 tablet (25 mg total) by mouth in the morning. HOLD if systolic blood pressure is less than 100 and/or heart rate is less than  55    Medications Discontinued During This Encounter  Medication Reason   lisinopril  (PRINIVIL ,ZESTRIL ) 10 MG tablet Dose change   metoprolol  succinate (TOPROL  XL) 25 MG 24 hr tablet       Current Outpatient Medications:    atorvastatin  (LIPITOR) 40 MG tablet, Take 40 mg by mouth daily. , Disp: , Rfl:    ELIQUIS 5 MG TABS tablet, Take 5 mg by mouth 2 (two) times daily., Disp: ,  Rfl: 3   lisinopril  (ZESTRIL ) 20 MG tablet, Take 1 tablet (20 mg total) by mouth daily., Disp: 30 tablet, Rfl: 3   Multiple Vitamin (MULTIVITAMIN WITH MINERALS) TABS tablet, Take 1 tablet by mouth daily., Disp: , Rfl:    sertraline (ZOLOFT) 50 MG tablet, Take 50 mg by mouth daily., Disp: , Rfl:    metoprolol  succinate (TOPROL  XL) 25 MG 24 hr tablet, Take 1 tablet (25 mg total) by mouth in the morning. HOLD if systolic blood pressure is less than 100 and/or heart rate is less than 55, Disp: , Rfl:   Consent:   NA  Disposition:   8 weeks or sooner if needed, likely secondary after TEE.  Patient may be asked to follow-up sooner based on the results of the above-mentioned testing.  His questions and concerns were addressed to his satisfaction. He voices understanding of the recommendations provided during this encounter.    Signed, Madonna Michele HAS, [facility]  A Division of [facility] [address] [place],  [address]  [place], [place] [address]  11/05/2023 8:53 AM

## 2023-11-05 NOTE — Patient Instructions (Signed)
 Medication Instructions:  INCREASE Lisinopril  to 20 mg once daily in the evening  CHANGE Metoprolol  Succinate (Toprol -XL) to the mornings  *If you need a refill on your cardiac medications before your next appointment, please call your pharmacy*  Lab Work: To be completed in 1 week: BMP  If you have labs (blood work) drawn today and your tests are completely normal, you will receive your results only by: MyChart Message (if you have MyChart) OR A paper copy in the mail If you have any lab test that is abnormal or we need to change your treatment, we will call you to review the results.  Testing/Procedures: None ordered today.  Follow-Up: At [facility], you and your health needs are our priority.  As part of our continuing mission to provide you with exceptional heart care, our providers are all part of one team.  This team includes your primary Cardiologist (physician) and Advanced Practice Providers or APPs (Physician Assistants and Nurse Practitioners) who all work together to provide you with the care you need, when you need it.  Your next appointment:   8 week(s)  Provider:   Madonna Large, DO    We recommend signing up for the patient portal called MyChart.  Sign up information is provided on this After Visit Summary.  MyChart is used to connect with patients for Virtual Visits (Telemedicine).  Patients are able to view lab/test results, encounter notes, upcoming appointments, etc.  Non-urgent messages can be sent to your provider as well.   To learn more about what you can do with MyChart, go to [email or web address].   Other Instructions Please remember to call our office back in 1 week to allow Dr. Large to discuss moving forward with a TEE (transesophageal echocardiogram).   You have been referred to PharmD for blood pressure management.

## 2023-11-11 DIAGNOSIS — I10 Essential (primary) hypertension: Secondary | ICD-10-CM | POA: Diagnosis not present

## 2023-11-12 ENCOUNTER — Ambulatory Visit: Payer: Self-pay | Admitting: Cardiology

## 2023-11-12 LAB — BASIC METABOLIC PANEL WITH GFR
BUN/Creatinine Ratio: 26 — ABNORMAL HIGH (ref 10–24)
BUN: 32 mg/dL — ABNORMAL HIGH (ref 8–27)
CO2: 21 mmol/L (ref 20–29)
Calcium: 10.2 mg/dL (ref 8.6–10.2)
Chloride: 96 mmol/L (ref 96–106)
Creatinine, Ser: 1.23 mg/dL (ref 0.76–1.27)
Glucose: 87 mg/dL (ref 70–99)
Potassium: 4.7 mmol/L (ref 3.5–5.2)
Sodium: 132 mmol/L — ABNORMAL LOW (ref 134–144)
eGFR: 60 mL/min/1.73 (ref >59)

## 2023-11-17 DIAGNOSIS — L538 Other specified erythematous conditions: Secondary | ICD-10-CM | POA: Diagnosis not present

## 2023-11-17 DIAGNOSIS — C44622 Squamous cell carcinoma of skin of right upper limb, including shoulder: Secondary | ICD-10-CM | POA: Diagnosis not present

## 2023-12-23 ENCOUNTER — Ambulatory Visit: Admitting: Pharmacist Clinician (PhC)/ Clinical Pharmacy Specialist

## 2023-12-25 DIAGNOSIS — Z23 Encounter for immunization: Secondary | ICD-10-CM | POA: Diagnosis not present

## 2024-01-02 ENCOUNTER — Ambulatory Visit: Admitting: Cardiology

## 2024-01-05 DIAGNOSIS — D0461 Carcinoma in situ of skin of right upper limb, including shoulder: Secondary | ICD-10-CM | POA: Diagnosis not present

## 2024-01-05 DIAGNOSIS — C44622 Squamous cell carcinoma of skin of right upper limb, including shoulder: Secondary | ICD-10-CM | POA: Diagnosis not present

## 2024-02-01 ENCOUNTER — Other Ambulatory Visit: Payer: Self-pay | Admitting: Cardiology

## 2024-02-01 DIAGNOSIS — I10 Essential (primary) hypertension: Secondary | ICD-10-CM

## 2024-02-04 DIAGNOSIS — L814 Other melanin hyperpigmentation: Secondary | ICD-10-CM | POA: Diagnosis not present

## 2024-02-04 DIAGNOSIS — Z08 Encounter for follow-up examination after completed treatment for malignant neoplasm: Secondary | ICD-10-CM | POA: Diagnosis not present

## 2024-02-04 DIAGNOSIS — D1801 Hemangioma of skin and subcutaneous tissue: Secondary | ICD-10-CM | POA: Diagnosis not present

## 2024-02-04 DIAGNOSIS — L570 Actinic keratosis: Secondary | ICD-10-CM | POA: Diagnosis not present

## 2024-02-04 DIAGNOSIS — L821 Other seborrheic keratosis: Secondary | ICD-10-CM | POA: Diagnosis not present

## 2024-02-04 DIAGNOSIS — Z85828 Personal history of other malignant neoplasm of skin: Secondary | ICD-10-CM | POA: Diagnosis not present

## 2024-02-04 DIAGNOSIS — L578 Other skin changes due to chronic exposure to nonionizing radiation: Secondary | ICD-10-CM | POA: Diagnosis not present

## 2024-02-10 ENCOUNTER — Telehealth: Payer: Self-pay

## 2024-02-10 NOTE — Telephone Encounter (Signed)
-----   Message from [facility] sent at 01/30/2024  6:59 PM EDT ----- Regarding: Follow up appointment This patient needs a follow-up appointment with me to discuss valvular heart disease.  He was going to talk to his wife after she returns from her trip regarding possible having TEE to evaluate AR.   The follow-up is non-urgent.  Sunit Butler, DO, FACC

## 2024-02-10 NOTE — Telephone Encounter (Signed)
 Called patient to schedule appointment per Dr. Michele. No answer. No DPR on file, LMTCB

## 2024-02-11 ENCOUNTER — Telehealth: Payer: Self-pay

## 2024-02-11 NOTE — Telephone Encounter (Signed)
-----   Message from [facility] sent at 01/30/2024  6:59 PM EDT ----- Regarding: Follow up appointment This patient needs a follow-up appointment with me to discuss valvular heart disease.  He was going to talk to his wife after she returns from her trip regarding possible having TEE to evaluate AR.   The follow-up is non-urgent.  Sunit Charlotte, DO, FACC

## 2024-02-11 NOTE — Telephone Encounter (Signed)
 Called pt., relayed Dr. Tyree note regarding scheduling a needed F/U for valvular disease. Patient stated wife is leaving for [place] and patient does not want to schedule with Dr. Michele until after AWV with PCP in December.

## 2024-03-08 DIAGNOSIS — E7800 Pure hypercholesterolemia, unspecified: Secondary | ICD-10-CM | POA: Diagnosis not present

## 2024-03-08 DIAGNOSIS — D6869 Other thrombophilia: Secondary | ICD-10-CM | POA: Diagnosis not present

## 2024-03-08 DIAGNOSIS — N1831 Chronic kidney disease, stage 3a: Secondary | ICD-10-CM | POA: Diagnosis not present

## 2024-03-08 DIAGNOSIS — F418 Other specified anxiety disorders: Secondary | ICD-10-CM | POA: Diagnosis not present

## 2024-03-08 DIAGNOSIS — I872 Venous insufficiency (chronic) (peripheral): Secondary | ICD-10-CM | POA: Diagnosis not present

## 2024-03-08 DIAGNOSIS — Z1331 Encounter for screening for depression: Secondary | ICD-10-CM | POA: Diagnosis not present

## 2024-03-08 DIAGNOSIS — I10 Essential (primary) hypertension: Secondary | ICD-10-CM | POA: Diagnosis not present

## 2024-03-08 DIAGNOSIS — I129 Hypertensive chronic kidney disease with stage 1 through stage 4 chronic kidney disease, or unspecified chronic kidney disease: Secondary | ICD-10-CM | POA: Diagnosis not present

## 2024-03-08 DIAGNOSIS — Z0000 Encounter for general adult medical examination without abnormal findings: Secondary | ICD-10-CM | POA: Diagnosis not present

## 2024-03-08 DIAGNOSIS — I482 Chronic atrial fibrillation, unspecified: Secondary | ICD-10-CM | POA: Diagnosis not present

## 2024-04-15 ENCOUNTER — Ambulatory Visit: Attending: Cardiology | Admitting: Cardiology

## 2024-04-15 ENCOUNTER — Encounter: Payer: Self-pay | Admitting: Cardiology

## 2024-04-15 VITALS — BP 140/78 | HR 70 | Resp 16 | Ht 67.0 in | Wt 150.0 lb

## 2024-04-15 DIAGNOSIS — I4819 Other persistent atrial fibrillation: Secondary | ICD-10-CM | POA: Diagnosis not present

## 2024-04-15 DIAGNOSIS — E782 Mixed hyperlipidemia: Secondary | ICD-10-CM

## 2024-04-15 DIAGNOSIS — Z7901 Long term (current) use of anticoagulants: Secondary | ICD-10-CM | POA: Diagnosis not present

## 2024-04-15 DIAGNOSIS — I359 Nonrheumatic aortic valve disorder, unspecified: Secondary | ICD-10-CM | POA: Diagnosis not present

## 2024-04-15 DIAGNOSIS — Z8673 Personal history of transient ischemic attack (TIA), and cerebral infarction without residual deficits: Secondary | ICD-10-CM | POA: Diagnosis not present

## 2024-04-15 DIAGNOSIS — I059 Rheumatic mitral valve disease, unspecified: Secondary | ICD-10-CM

## 2024-04-15 MED ORDER — METOPROLOL SUCCINATE ER 25 MG PO TB24: 25.0000 mg | ORAL_TABLET | Freq: Every morning | ORAL | 3 refills | Status: AC

## 2024-04-15 NOTE — Progress Notes (Signed)
 "   Cardiology Office Note:    NAME:  Preston Chapman    MRN: [id number] DOB:  1945-08-10   PCP:  Elliot Charm, MD  Former Cardiology Providers: N/A Primary Cardiologist:  Madonna Large, DO, [facility] (established care 04/11/2023) Electrophysiologist:  None    Chief Complaint  Patient presents with   Follow-up    Atrial fibrillation and valvular heart disease    History of Present Illness:    Preston Chapman is a 79 y.o. Caucasian male whose past medical history and cardiovascular risk factors includes: Chronic atrial fibrillation, history of stroke, aortic stenosis, hypertension, hyperlipidemia.   Patient was referred to the practice for evaluation and management of atrial fibrillation.    According to him he was diagnosed with A-fib in 2014 and since then has been on rate control strategy and anticoagulation for thromboembolic prophylaxis.  Initially he was on Xarelto  and later transitioned to Eliquis which she has been tolerating for several years.    At the last office visit patient was recommended to undergo transesophageal echocardiogram given the severity of his aortic regurgitation and mitral regurgitation noted on transthoracic echo.  However, patient wanted to hold off further workup until he discusses it further with his wife who was out of the country at that time.  He presents today for follow-up.  Patient is accompanied by his wife Preston Chapman at today's office visit. Patient denies anginal chest pain or heart failure symptoms. He has noticed feeling more tired and fatigued when compared to 6 months ago but has been attributing it to his age.  His functional capacity has been limited due to difficulty walking.  But he still manages to walk 4 miles per day but avoids treadmills to prevent falls.  States that he does not want to use a walker or wheelchair for long distances to prevent falls. Home blood pressures have been trending up around 140 mmHg.  Patient states that his lisinopril   dose was recently reduced from 20 mg p.o. daily to 10 mg.  Current Medications: Current Meds  Medication Sig   atorvastatin  (LIPITOR) 40 MG tablet Take 40 mg by mouth daily.    ELIQUIS 5 MG TABS tablet Take 5 mg by mouth 2 (two) times daily.   lisinopril  (ZESTRIL ) 20 MG tablet TAKE 1 TABLET BY MOUTH EVERY DAY   Multiple Vitamin (MULTIVITAMIN WITH MINERALS) TABS tablet Take 1 tablet by mouth daily.   sertraline (ZOLOFT) 50 MG tablet Take 50 mg by mouth daily.   [DISCONTINUED] metoprolol  succinate (TOPROL  XL) 25 MG 24 hr tablet Take 1 tablet (25 mg total) by mouth in the morning. HOLD if systolic blood pressure is less than 100 and/or heart rate is less than 55     Allergies:    Penicillins   Past Medical History: Past Medical History:  Diagnosis Date   Atrial fibrillation (HCC)    Essential hypertension, benign    Hyperlipidemia    Unspecified late effects of cerebrovascular disease     Past Surgical History: Past Surgical History:  Procedure Laterality Date   CATARACT EXTRACTION Bilateral     Social History: Social History   Tobacco Use   Smoking status: Never   Smokeless tobacco: Never  Substance Use Topics   Alcohol use: Yes    Comment: 1 drink daily   Drug use: No    Family History: Family History  Problem Relation Age of Onset   Hypertension Mother    Cancer Mother    Hypertension Father  Heart attack Father     ROS:   Review of Systems  Constitutional: Positive for malaise/fatigue.  Cardiovascular:  Negative for chest pain, claudication, irregular heartbeat, leg swelling, near-syncope, orthopnea, palpitations, paroxysmal nocturnal dyspnea and syncope.  Respiratory:  Negative for shortness of breath.   Hematologic/Lymphatic: Negative for bleeding problem.    EKGs/Labs/Other Studies Reviewed:   EKG: EKG Interpretation Date/Time:  Thursday April 15 2024 08:06:51 EST Ventricular Rate:  63 PR Interval:    QRS Duration:  108 QT Interval:  444 QTC  Calculation: 454 R Axis:   102  Text Interpretation: Atrial fibrillation Rightward axis When compared with ECG of 05-Nov-2023 08:07, No significant change was found Confirmed by Michele Richardson [phone]) on 04/15/2024 8:20:32 AM  Echocardiogram: 09/2023 1. Left ventricular ejection fraction, by estimation, is 60 to 65%. The left ventricle has normal function. The left ventricle has no regional wall motion abnormalities. There is moderate concentric left ventricular hypertrophy of the basal-septal  segment. 2. Right ventricular systolic function is normal. The right ventricular size is normal. There is normal pulmonary artery systolic pressure. 3. Left atrial size was severely dilated. 4. Right atrial size was severely dilated. 5. The mitral valve is normal in structure. Moderate to severe mitral valve regurgitation. No evidence of mitral stenosis. 6. The aortic valve is calcified and degenerative. Aortic valve regurgitation is severe. No aortic stenosis is present. 7. Aortic dilatation noted. There is mild dilatation of the ascending aorta, measuring 42 mm. 8. The inferior vena cava is normal in size with greater than 50% respiratory variability, suggesting right atrial pressure of 3 mmHg.   Labs: External labs provided by PCP Collected 11//2024 Total cholesterol 165, triglycerides 99, HDL 63, calculated LDL 84, non-HDL 102 BUN 38, creatinine 1.37. eGFR 53. Sodium 135, potassium 5.3, chloride 99, bicarb 32. AST, ALT, alkaline phosphatase within normal limits. Hemoglobin 15.4  External Labs: Collected: March 08, 2024 [facility] database. Total cholesterol 160, triglycerides 109, HDL 63, LDL 78. Hemoglobin 14.6. Potassium 5.3.  Physical Exam:    Today's Vitals   04/15/24 0803  BP: (!) 140/78  Pulse: 70  Resp: 16  SpO2: 99%  Weight: 150 lb (68 kg)  Height: 5' 7 (1.702 m)   Body mass index is 23.49 kg/m. Wt Readings from Last 3 Encounters:  04/15/24 150 lb (68 kg)  11/05/23 152 lb  (68.9 kg)  04/11/23 154 lb 3.2 oz (69.9 kg)    Physical Exam  Constitutional: No distress.  hemodynamically stable  Neck: No JVD present.  Cardiovascular: Normal rate, regular rhythm, S1 normal and S2 normal. Exam reveals no gallop, no S3 and no S4.  Murmur heard. High-pitched blowing holosystolic murmur is present with a grade of 3/6 at the apex. Blowing decrescendo early diastolic murmur is present with a grade of 3/4 at the upper right sternal border radiating to the apex. Pulmonary/Chest: Effort normal and breath sounds normal. No stridor. He has no wheezes. He has no rales.  Abdominal: Soft. Bowel sounds are normal. He exhibits no distension. There is no abdominal tenderness.  Musculoskeletal:        General: No edema.     Cervical back: Neck supple.  Neurological: He is alert and oriented to person, place, and time. He has intact cranial nerves (2-12).  Skin: Skin is warm.     Impression & Recommendation(s):  Impression:   ICD-10-CM   1. Persistent atrial fibrillation (HCC)  I48.19 EKG 12-Lead    metoprolol  succinate (TOPROL  XL) 25 MG 24 hr  tablet    2. Long term (current) use of anticoagulants  Z79.01     3. Aortic valve disease  I35.9 ECHOCARDIOGRAM COMPLETE    4. Mitral valve disease  I05.9 ECHOCARDIOGRAM COMPLETE    5. History of stroke  Z86.73     6. Mixed hyperlipidemia  E78.2 Potassium    Basic Metabolic Panel (BMET)       Recommendation(s):  Persistent atrial fibrillation (HCC) Long term (current) use of anticoagulants Diagnosed with atrial fibrillation in 2014. No prior history of direct-current cardioversion or atrial fibrillation ablation.  Patient prefers medical management. Rate control: Toprol -XL. Rhythm control: N/A Anticoagulation: Eliquis Does not endorse evidence of bleeding. In the past Xarelto  was transitioned to Eliquis and has done well. EKG today notes rate controlled A-fib  Moderate to severe mitral regurgitation. Severe aortic  regurgitation Echocardiogram was performed as he was noted to have a cardiac murmur on physical examination. Most recent echo notes mitral and aortic regurgitation. At the last office visit, recommended TEE for further evaluation and management.  However, patient wanted to hold off on proceeding forward until he discusses it with his wife Preston Chapman when she returns back from [place].  But he never called us  back with regards to their wishes until today's office visit. Spent a significant time discussing valvular heart disease management with both patient and his wife Preston Chapman at today's office visit. Shared decision was to repeat echocardiogram to reevaluate disease progression, LVEF, and chamber quantification.  In the meantime he will also focus on improving blood pressure management. Based on the results of the transthoracic echocardiogram he is willing to proceed forward with TEE if needed for further evaluation and management.  We discussed the risks, benefits, and alternatives to transesophageal echocardiogram today.  Benign hypertension Office blood pressures remain elevated. Few blood pressure readings from home provided to me for review SBP around 140 mmHg His lisinopril  was down titrated to 10 mg p.o. daily. Recommended increasing lisinopril  back to 20 mg p.o. every p.m., prescription provided Recommended taking metoprolol  succinate 25 mg p.o. every morning, prescription provided In the past I had referred him to Pharm.D. clinic for close blood pressure monitoring but patient had refused.  Mixed hyperlipidemia Currently on Lipitor 40 mg p.o. daily and.   He denies myalgia or other side effects. Most recent lipids dated November 2024 reviewed as noted above.  Orders Placed:  Orders Placed This Encounter  Procedures   Potassium   Basic Metabolic Panel (BMET)   EKG 12-Lead   ECHOCARDIOGRAM COMPLETE    Standing Status:   Future    Expected Date:   06/03/2024    Expiration Date:   04/15/2025     Where should this test be performed[facility]    Does the patient weigh less than or greater than 250 lbs?:   Patient weighs less than 250 lbs    Perflutren DEFINITY (image enhancing agent) should be administered unless hypersensitivity or allergy exist:   Administer Perflutren    Reason for exam-Echo:   Aortic regurgitation I35.1    Final Medication List:    Meds ordered this encounter  Medications   metoprolol  succinate (TOPROL  XL) 25 MG 24 hr tablet    Sig: Take 1 tablet (25 mg total) by mouth in the morning. HOLD if systolic blood pressure is less than 100 and/or heart rate is less than 55    Dispense:  90 tablet    Refill:  3    Medications Discontinued  During This Encounter  Medication Reason   metoprolol  succinate (TOPROL  XL) 25 MG 24 hr tablet Reorder       Current Outpatient Medications:    atorvastatin  (LIPITOR) 40 MG tablet, Take 40 mg by mouth daily. , Disp: , Rfl:    ELIQUIS 5 MG TABS tablet, Take 5 mg by mouth 2 (two) times daily., Disp: , Rfl: 3   lisinopril  (ZESTRIL ) 20 MG tablet, TAKE 1 TABLET BY MOUTH EVERY DAY, Disp: 90 tablet, Rfl: 2   Multiple Vitamin (MULTIVITAMIN WITH MINERALS) TABS tablet, Take 1 tablet by mouth daily., Disp: , Rfl:    sertraline (ZOLOFT) 50 MG tablet, Take 50 mg by mouth daily., Disp: , Rfl:    metoprolol  succinate (TOPROL  XL) 25 MG 24 hr tablet, Take 1 tablet (25 mg total) by mouth in the morning. HOLD if systolic blood pressure is less than 100 and/or heart rate is less than 55, Disp: 90 tablet, Rfl: 3  Consent:   NA  Disposition:   March 2026 after TEE and better blood pressure management  His questions and concerns were addressed to his satisfaction. He voices understanding of the recommendations provided during this encounter.    Signed, Madonna Michele HAS, [facility]  A Division of [facility] [address] [place], [place] [address]  [place], [place] [id number]  04/15/2024 11:50 AM   "

## 2024-04-15 NOTE — Patient Instructions (Addendum)
 Medication Instructions:  INCREASE your dosage of Lisinopril  (Zestril ). Take one (1) whole tablet, 20 mg, by mouth nightly.  *If you need a refill on your cardiac medications before your next appointment, please call your pharmacy*  Lab Work: BMP in one week after increasing lisinopril   If you have labs (blood work) drawn today and your tests are completely normal, you will receive your results only by: MyChart Message (if you have MyChart) OR A paper copy in the mail If you have any lab test that is abnormal or we need to change your treatment, we will call you to review the results.  Testing/Procedures: Echocardiogram  Follow-Up: At [facility], you and your health needs are our priority.  As part of our continuing mission to provide you with exceptional heart care, our providers are all part of one team.  This team includes your primary Cardiologist (physician) and Advanced Practice Providers or APPs (Physician Assistants and Nurse Practitioners) who all work together to provide you with the care you need, when you need it.  Your next appointment:   10 week(s)  Provider:   Madonna Large, DO    We recommend signing up for the patient portal called MyChart.  Sign up information is provided on this After Visit Summary.  MyChart is used to connect with patients for Virtual Visits (Telemedicine).  Patients are able to view lab/test results, encounter notes, upcoming appointments, etc.  Non-urgent messages can be sent to your provider as well.   To learn more about what you can do with MyChart, go to [email or web address].   Other Instructions Your physician has requested that you have an echocardiogram. Echocardiography is a painless test that uses sound waves to create images of your heart. It provides your doctor with information about the size and shape of your heart and how well your hearts chambers and valves are working. This procedure takes approximately one hour. There  are no restrictions for this procedure. Please do NOT wear cologne, perfume, aftershave, or lotions (deodorant is allowed). Please arrive 15 minutes prior to your appointment time.  Please note: We ask at that you not bring children with you during ultrasound (echo/ vascular) testing. Due to room size and safety concerns, children are not allowed in the ultrasound rooms during exams. Our front office staff cannot provide observation of children in our lobby area while testing is being conducted. An adult accompanying a patient to their appointment will only be allowed in the ultrasound room at the discretion of the ultrasound technician under special circumstances. We apologize for any inconvenience.

## 2024-04-22 LAB — BASIC METABOLIC PANEL WITH GFR
BUN/Creatinine Ratio: 24 (ref 10–24)
BUN: 32 mg/dL — ABNORMAL HIGH (ref 8–27)
CO2: 21 mmol/L (ref 20–29)
Calcium: 10.2 mg/dL (ref 8.6–10.2)
Chloride: 99 mmol/L (ref 96–106)
Creatinine, Ser: 1.31 mg/dL — ABNORMAL HIGH (ref 0.76–1.27)
Glucose: 99 mg/dL (ref 70–99)
Potassium: 4.2 mmol/L (ref 3.5–5.2)
Sodium: 137 mmol/L (ref 134–144)
eGFR: 55 mL/min/1.73 — ABNORMAL LOW

## 2024-04-25 ENCOUNTER — Ambulatory Visit: Payer: Self-pay | Admitting: Cardiology

## 2024-06-03 ENCOUNTER — Ambulatory Visit (HOSPITAL_COMMUNITY)

## 2024-07-02 ENCOUNTER — Ambulatory Visit: Admitting: Cardiology
